# Patient Record
Sex: Female | Born: 1995 | Race: White | Hispanic: No | Marital: Married | Smoking: Never smoker
Health system: Southern US, Community
[De-identification: ages and names within clinical notes are randomized; demographics above are authoritative.]

## PROBLEM LIST (undated history)

## (undated) DIAGNOSIS — N809 Endometriosis, unspecified: Secondary | ICD-10-CM

## (undated) DIAGNOSIS — T7840XA Allergy, unspecified, initial encounter: Secondary | ICD-10-CM

## (undated) DIAGNOSIS — G709 Myoneural disorder, unspecified: Secondary | ICD-10-CM

## (undated) HISTORY — DX: Endometriosis, unspecified: N80.9

## (undated) HISTORY — DX: Myoneural disorder, unspecified: G70.9

## (undated) HISTORY — PX: OTHER SURGICAL HISTORY: SHX169

## (undated) HISTORY — PX: KNEE ARTHROSCOPY W/ ACL RECONSTRUCTION: SHX1858

## (undated) HISTORY — DX: Allergy, unspecified, initial encounter: T78.40XA

---
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
## 2015-07-01 ENCOUNTER — Ambulatory Visit (INDEPENDENT_AMBULATORY_CARE_PROVIDER_SITE_OTHER): Payer: 59 | Admitting: Family Medicine

## 2015-07-01 VITALS — BP 110/62 | HR 67 | Temp 98.6°F | Resp 18 | Ht 66.0 in | Wt 139.0 lb

## 2015-07-01 DIAGNOSIS — M25531 Pain in right wrist: Secondary | ICD-10-CM

## 2015-07-01 DIAGNOSIS — S93402A Sprain of unspecified ligament of left ankle, initial encounter: Secondary | ICD-10-CM

## 2015-07-01 NOTE — Patient Instructions (Addendum)
Avoid repetitive use of your right wrist for next few weeks, ok to use silicone or other cushioned pad under wrist with writing or computer work. Wrist brace ok if this helps. If not improved in next 2 weeks - return to xray and discuss other causes (as "TFCC injury" can present this way).   Ankle range of motion and exercises for your ankle. Return to the clinic or go to the nearest emergency room if any of your symptoms worsen or new symptoms occur.  Acute Ankle Sprain With Phase I Rehab An acute ankle sprain is a partial or complete tear in one or more of the ligaments of the ankle due to traumatic injury. The severity of the injury depends on both the number of ligaments sprained and the grade of sprain. There are 3 grades of sprains.   A grade 1 sprain is a mild sprain. There is a slight pull without obvious tearing. There is no loss of strength, and the muscle and ligament are the correct length.  A grade 2 sprain is a moderate sprain. There is tearing of fibers within the substance of the ligament where it connects two bones or two cartilages. The length of the ligament is increased, and there is usually decreased strength.  A grade 3 sprain is a complete rupture of the ligament and is uncommon. In addition to the grade of sprain, there are three types of ankle sprains.  Lateral ankle sprains: This is a sprain of one or more of the three ligaments on the outer side (lateral) of the ankle. These are the most common sprains. Medial ankle sprains: There is one large triangular ligament of the inner side (medial) of the ankle that is susceptible to injury. Medial ankle sprains are less common. Syndesmosis, "high ankle," sprains: The syndesmosis is the ligament that connects the two bones of the lower leg. Syndesmosis sprains usually only occur with very severe ankle sprains. SYMPTOMS  Pain, tenderness, and swelling in the ankle, starting at the side of injury that may progress to the whole ankle  and foot with time.  "Pop" or tearing sensation at the time of injury.  Bruising that may spread to the heel.  Impaired ability to walk soon after injury. CAUSES   Acute ankle sprains are caused by trauma placed on the ankle that temporarily forces or pries the anklebone (talus) out of its normal socket.  Stretching or tearing of the ligaments that normally hold the joint in place (usually due to a twisting injury). RISK INCREASES WITH:  Previous ankle sprain.  Sports in which the foot may land awkwardly (i.e., basketball, volleyball, or soccer) or walking or running on uneven or rough surfaces.  Shoes with inadequate support to prevent sideways motion when stress occurs.  Poor strength and flexibility.  Poor balance skills.  Contact sports. PREVENTION   Warm up and stretch properly before activity.  Maintain physical fitness:  Ankle and leg flexibility, muscle strength, and endurance.  Cardiovascular fitness.  Balance training activities.  Use proper technique and have a coach correct improper technique.  Taping, protective strapping, bracing, or high-top tennis shoes may help prevent injury. Initially, tape is best; however, it loses most of its support function within 10 to 15 minutes.  Wear proper-fitted protective shoes (High-top shoes with taping or bracing is more effective than either alone).  Provide the ankle with support during sports and practice activities for 12 months following injury. PROGNOSIS   If treated properly, ankle sprains can be expected  to recover completely; however, the length of recovery depends on the degree of injury.  A grade 1 sprain usually heals enough in 5 to 7 days to allow modified activity and requires an average of 6 weeks to heal completely.  A grade 2 sprain requires 6 to 10 weeks to heal completely.  A grade 3 sprain requires 12 to 16 weeks to heal.  A syndesmosis sprain often takes more than 3 months to heal. RELATED  COMPLICATIONS   Frequent recurrence of symptoms may result in a chronic problem. Appropriately addressing the problem the first time decreases the frequency of recurrence and optimizes healing time. Severity of the initial sprain does not predict the likelihood of later instability.  Injury to other structures (bone, cartilage, or tendon).  A chronically unstable or arthritic ankle joint is a possibility with repeated sprains. TREATMENT Treatment initially involves the use of ice, medication, and compression bandages to help reduce pain and inflammation. Ankle sprains are usually immobilized in a walking cast or boot to allow for healing. Crutches may be recommended to reduce pressure on the injury. After immobilization, strengthening and stretching exercises may be necessary to regain strength and a full range of motion. Surgery is rarely needed to treat ankle sprains. MEDICATION   Nonsteroidal anti-inflammatory medications, such as aspirin and ibuprofen (do not take for the first 3 days after injury or within 7 days before surgery), or other minor pain relievers, such as acetaminophen, are often recommended. Take these as directed by your caregiver. Contact your caregiver immediately if any bleeding, stomach upset, or signs of an allergic reaction occur from these medications.  Ointments applied to the skin may be helpful.  Pain relievers may be prescribed as necessary by your caregiver. Do not take prescription pain medication for longer than 4 to 7 days. Use only as directed and only as much as you need. HEAT AND COLD  Cold treatment (icing) is used to relieve pain and reduce inflammation for acute and chronic cases. Cold should be applied for 10 to 15 minutes every 2 to 3 hours for inflammation and pain and immediately after any activity that aggravates your symptoms. Use ice packs or an ice massage.  Heat treatment may be used before performing stretching and strengthening activities  prescribed by your caregiver. Use a heat pack or a warm soak. SEEK IMMEDIATE MEDICAL CARE IF:   Pain, swelling, or bruising worsens despite treatment.  You experience pain, numbness, discoloration, or coldness in the foot or toes.  New, unexplained symptoms develop (drugs used in treatment may produce side effects.) EXERCISES  PHASE I EXERCISES RANGE OF MOTION (ROM) AND STRETCHING EXERCISES - Ankle Sprain, Acute Phase I, Weeks 1 to 2 These exercises may help you when beginning to restore flexibility in your ankle. You will likely work on these exercises for the 1 to 2 weeks after your injury. Once your physician, physical therapist, or athletic trainer sees adequate progress, he or she will advance your exercises. While completing these exercises, remember:   Restoring tissue flexibility helps normal motion to return to the joints. This allows healthier, less painful movement and activity.  An effective stretch should be held for at least 30 seconds.  A stretch should never be painful. You should only feel a gentle lengthening or release in the stretched tissue. RANGE OF MOTION - Dorsi/Plantar Flexion  While sitting with your right / left knee straight, draw the top of your foot upwards by flexing your ankle. Then reverse the motion, pointing  your toes downward.  Hold each position for __________ seconds.  After completing your first set of exercises, repeat this exercise with your knee bent. Repeat __________ times. Complete this exercise __________ times per day.  RANGE OF MOTION - Ankle Alphabet  Imagine your right / left big toe is a pen.  Keeping your hip and knee still, write out the entire alphabet with your "pen." Make the letters as large as you can without increasing any discomfort. Repeat __________ times. Complete this exercise __________ times per day.  STRENGTHENING EXERCISES - Ankle Sprain, Acute -Phase I, Weeks 1 to 2 These exercises may help you when beginning to  restore strength in your ankle. You will likely work on these exercises for 1 to 2 weeks after your injury. Once your physician, physical therapist, or athletic trainer sees adequate progress, he or she will advance your exercises. While completing these exercises, remember:   Muscles can gain both the endurance and the strength needed for everyday activities through controlled exercises.  Complete these exercises as instructed by your physician, physical therapist, or athletic trainer. Progress the resistance and repetitions only as guided.  You may experience muscle soreness or fatigue, but the pain or discomfort you are trying to eliminate should never worsen during these exercises. If this pain does worsen, stop and make certain you are following the directions exactly. If the pain is still present after adjustments, discontinue the exercise until you can discuss the trouble with your clinician. STRENGTH - Dorsiflexors  Secure a rubber exercise band/tubing to a fixed object (i.e., table, pole) and loop the other end around your right / left foot.  Sit on the floor facing the fixed object. The band/tubing should be slightly tense when your foot is relaxed.  Slowly draw your foot back toward you using your ankle and toes.  Hold this position for __________ seconds. Slowly release the tension in the band and return your foot to the starting position. Repeat __________ times. Complete this exercise __________ times per day.  STRENGTH - Plantar-flexors   Sit with your right / left leg extended. Holding onto both ends of a rubber exercise band/tubing, loop it around the ball of your foot. Keep a slight tension in the band.  Slowly push your toes away from you, pointing them downward.  Hold this position for __________ seconds. Return slowly, controlling the tension in the band/tubing. Repeat __________ times. Complete this exercise __________ times per day.  STRENGTH - Ankle Eversion  Secure  one end of a rubber exercise band/tubing to a fixed object (table, pole). Loop the other end around your foot just before your toes.  Place your fists between your knees. This will focus your strengthening at your ankle.  Drawing the band/tubing across your opposite foot, slowly, pull your little toe out and up. Make sure the band/tubing is positioned to resist the entire motion.  Hold this position for __________ seconds. Have your muscles resist the band/tubing as it slowly pulls your foot back to the starting position.  Repeat __________ times. Complete this exercise __________ times per day.  STRENGTH - Ankle Inversion  Secure one end of a rubber exercise band/tubing to a fixed object (table, pole). Loop the other end around your foot just before your toes.  Place your fists between your knees. This will focus your strengthening at your ankle.  Slowly, pull your big toe up and in, making sure the band/tubing is positioned to resist the entire motion.  Hold this position for  __________ seconds.  Have your muscles resist the band/tubing as it slowly pulls your foot back to the starting position. Repeat __________ times. Complete this exercises __________ times per day.  STRENGTH - Towel Curls  Sit in a chair positioned on a non-carpeted surface.  Place your right / left foot on a towel, keeping your heel on the floor.  Pull the towel toward your heel by only curling your toes. Keep your heel on the floor.  If instructed by your physician, physical therapist, or athletic trainer, add weight to the end of the towel. Repeat __________ times. Complete this exercise __________ times per day.   This information is not intended to replace advice given to you by your health care provider. Make sure you discuss any questions you have with your health care provider.   Document Released: 02/17/2005 Document Revised: 08/09/2014 Document Reviewed: 10/31/2008 [employer] Interactive Patient  Education 2016 [employer]   Wrist Pain There are many things that can cause wrist pain. Some common causes include:  An injury to the wrist area, such as a sprain, strain, or fracture.  Overuse of the joint.  A condition that causes increased pressure on a nerve in the wrist (carpal tunnel syndrome).  Wear and tear of the joints that occurs with aging (osteoarthritis).  A variety of other types of arthritis. Sometimes, the cause of wrist pain is not known. The pain often goes away when you follow your health care provider's instructions for relieving pain at home. If your wrist pain continues, tests may need to be done to diagnose your condition. HOME CARE INSTRUCTIONS Pay attention to any changes in your symptoms. Take these actions to help with your pain:  Rest the wrist area for at least 48 hours or as told by your health care provider.  If directed, apply ice to the injured area:  Put ice in a plastic bag.  Place a towel between your skin and the bag.  Leave the ice on for 20 minutes, 2-3 times per day.  Keep your arm raised (elevated) above the level of your heart while you are sitting or lying down.  If a splint or elastic bandage has been applied, use it as told by your health care provider.  Remove the splint or bandage only as told by your health care provider.  Loosen the splint or bandage if your fingers become numb or have a tingling feeling, or if they turn cold or blue.  Take over-the-counter and prescription medicines only as told by your health care provider.  Keep all follow-up visits as told by your health care provider. This is important. SEEK MEDICAL CARE IF:  Your pain is not helped by treatment.  Your pain gets worse. SEEK IMMEDIATE MEDICAL CARE IF:  Your fingers become swollen.  Your fingers turn white, very red, or cold and blue.  Your fingers are numb or have a tingling feeling.  You have difficulty moving your fingers.   This  information is not intended to replace advice given to you by your health care provider. Make sure you discuss any questions you have with your health care provider.   Document Released: 04/28/2005 Document Revised: 04/09/2015 Document Reviewed: 12/04/2014 [employer] Interactive Patient Education [employer]

## 2015-07-01 NOTE — Progress Notes (Addendum)
Subjective:  This chart was scribed for Mary Staggers, MD by Broadus John, Medical Scribe. This patient was seen in Room 1 and the patient's care was started at 1:38 PM.   Patient ID: Mary Reilly, female    DOB: [birth year], 19 y.o.   MRN: [id number]  Chief Complaint  Patient presents with  . Ankle Injury    left rolled over the weekend needs note for school  . Wrist Pain    rt pain x2 mths now     HPI HPI Comments: Mary Reilly is a 19 y.o. female who presents to [facility] complaining of a left ankle injury, onset 4 days ago.  Pt notes that she rolled and  inverted her ankle while playing Frisbee, however she was still able to weight bear since onset. She indicates the area was sore after onset of injury, however she reports that the pain has improved. Pt denies numbness or tingling of the area. Pt is requesting a note for school due to missing an assignment.    Pt also reports a right wrist pain that first started about 2 months ago. She notes that the pain could be attributed to her sleeping position where she sleeps on her right arm. 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 Pt also notes that she lifts weight. Pt's pain has been worsening during the past 2 weeks, and she believes that could be due to playing Frisbee more often. She states that the pain is more severe with supination of the hand, however not as much with rotation. She indicates that she wrapped with an ACE bandage, however she has not been compliant with that very much. Pt denies numbness or tingling of the area, unexpected weight loss, or fever. Pt is right-handed.  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   There are no active problems to display for this patient.  Past Medical History  Diagnosis Date  . Allergy   .  Neuromuscular disorder [facility])    Past Surgical History  Procedure Laterality Date  . Knee arthroscopy w/ acl reconstruction     Allergies  Allergen Reactions  . Cephalosporins   . Sulfa Antibiotics    Prior to Admission medications   Medication Sig Start Date End Date Taking? Authorizing Provider  etonogestrel-ethinyl estradiol (NUVARING) 0.12-0.015 MG/24HR vaginal ring Place 1 each vaginally every 28 (twenty-eight) days. Insert vaginally and leave in place for 3 consecutive weeks, then remove for 1 week.   Yes Historical Provider, MD  fluocinonide cream (LIDEX) 0.05 % Apply 1 application topically 2 (two) times daily.   Yes Historical Provider, MD   Social History   Social History  . Marital Status: Single    Spouse Name: N/A  . Number of Children: N/A  . Years of Education: N/A   Occupational History  . Not on file.   Social History Main Topics  . Smoking status: Never Smoker   . Smokeless tobacco: Not on file  . Alcohol Use: No  . Drug Use: No  . Sexual Activity: Not on file   Other Topics Concern  . Not on file   Social History Narrative  . No narrative on file    Review of Systems  Constitutional: Negative for fever and unexpected weight change.  Musculoskeletal: Positive  for arthralgias.  Neurological: Negative for weakness and numbness.      Objective:   Physical Exam  Constitutional: She is oriented to person, place, and time. She appears well-developed and well-nourished. No distress.  HENT:  Head: Normocephalic and atraumatic.  Eyes: EOM are normal. Pupils are equal, round, and reactive to light.  Neck: Neck supple.  Cardiovascular: Normal rate.   Pulmonary/Chest: Effort normal.  Musculoskeletal:  Right knee- Negative tinel's over the fibular head. Tip fib- lower tip-fib negative squeeze. Negative kleiger/external rotation stress testing.  Right ankle- Achilles is non tender. Malleoli non tender. Slight laxity with talar tilt. Negative drawer.    Right foot- no navicular or 5th metatarsal tenderness. ROM is intact. Pain with resisted eversion but strength is intact. nvi distally.   Right wrist- skin intact, no erythema. Equal ROM to the left, but pain with supination and radial deviation. Radius-ulnar non tender. Forearm non tender. DRUJ non tender. Radial and ulnar styloid non tender. Scaphoid non tender. Hook of the hamate non tender. Negative finkelstein. Negative tinel's at the wrist and ulnar wrist.  Negative phalen's.  Neurological: She is alert and oriented to person, place, and time. No cranial nerve deficit.  Skin: Skin is warm and dry.  Psychiatric: She has a normal mood and affect. Her behavior is normal.  Nursing note and vitals reviewed.   Filed Vitals:   07/01/15 1256  BP: 110/62  Pulse: 67  Temp: 98.6 F (37 C)  TempSrc: Oral  Resp: 18  Height:  (1.676 m)  Weight: 139 lb (63.05 kg)  SpO2: 99%      Assessment & Plan:   Mary Reilly is a 19 y.o. female Right wrist pain  - Possible overuse injury versus compressive neuropathy of radial nerve at Guyon's, but would suspect more dysesthesias into fourth and fifth fingers.  Also based on location, TFCC injury in differential diagnosis, but no specific injury known.  -Relative rest, avoid maneuvers that trigger pain in that area, okay to use silicone or other padding when writing, and recheck in the next 2 weeks if symptoms persist. At that time would consider x-rays or possible hand surgery eval for possible TFCC issue.  Left ankle sprain, initial encounter  -Mild, suspect grade 1 without significant swelling.  -Continue range of motion, home exercise program. RTC precautions.  -Note given for school as with her major she has a class requiring jogging. Return to activity next week as long as her symptoms have improved.   Patient Instructions  Avoid repetitive use of your right wrist for next few weeks, ok to use silicone or other cushioned pad under wrist  with writing or computer work. Wrist brace ok if this helps. If not improved in next 2 weeks - return to xray and discuss other causes (as "TFCC injury" can present this way).   Ankle range of motion and exercises for your ankle. Return to the clinic or go to the nearest emergency room if any of your symptoms worsen or new symptoms occur.  Acute Ankle Sprain With Phase I Rehab An acute ankle sprain is a partial or complete tear in one or more of the ligaments of the ankle due to traumatic injury. The severity of the injury depends on both the number of ligaments sprained and the grade of sprain. There are 3 grades of sprains.   A grade 1 sprain is a mild sprain. There is a slight pull without obvious tearing. There is no loss of strength, and the  muscle and ligament are the correct length.  A grade 2 sprain is a moderate sprain. There is tearing of fibers within the substance of the ligament where it connects two bones or two cartilages. The length of the ligament is increased, and there is usually decreased strength.  A grade 3 sprain is a complete rupture of the ligament and is uncommon. In addition to the grade of sprain, there are three types of ankle sprains.  Lateral ankle sprains: This is a sprain of one or more of the three ligaments on the outer side (lateral) of the ankle. These are the most common sprains. Medial ankle sprains: There is one large triangular ligament of the inner side (medial) of the ankle that is susceptible to injury. Medial ankle sprains are less common. Syndesmosis, "high ankle," sprains: The syndesmosis is the ligament that connects the two bones of the lower leg. Syndesmosis sprains usually only occur with very severe ankle sprains. SYMPTOMS  Pain, tenderness, and swelling in the ankle, starting at the side of injury that may progress to the whole ankle and foot with time.  "Pop" or tearing sensation at the time of injury.  Bruising that may spread to the  heel.  Impaired ability to walk soon after injury. CAUSES   Acute ankle sprains are caused by trauma placed on the ankle that temporarily forces or pries the anklebone (talus) out of its normal socket.  Stretching or tearing of the ligaments that normally hold the joint in place (usually due to a twisting injury). RISK INCREASES WITH:  Previous ankle sprain.  Sports in which the foot may land awkwardly (i.e., basketball, volleyball, or soccer) or walking or running on uneven or rough surfaces.  Shoes with inadequate support to prevent sideways motion when stress occurs.  Poor strength and flexibility.  Poor balance skills.  Contact sports. PREVENTION   Warm up and stretch properly before activity.  Maintain physical fitness:  Ankle and leg flexibility, muscle strength, and endurance.  Cardiovascular fitness.  Balance training activities.  Use proper technique and have a coach correct improper technique.  Taping, protective strapping, bracing, or high-top tennis shoes may help prevent injury. Initially, tape is best; however, it loses most of its support function within 10 to 15 minutes.  Wear proper-fitted protective shoes (High-top shoes with taping or bracing is more effective than either alone).  Provide the ankle with support during sports and practice activities for 12 months following injury. PROGNOSIS   If treated properly, ankle sprains can be expected to recover completely; however, the length of recovery depends on the degree of injury.  A grade 1 sprain usually heals enough in 5 to 7 days to allow modified activity and requires an average of 6 weeks to heal completely.  A grade 2 sprain requires 6 to 10 weeks to heal completely.  A grade 3 sprain requires 12 to 16 weeks to heal.  A syndesmosis sprain often takes more than 3 months to heal. RELATED COMPLICATIONS   Frequent recurrence of symptoms may result in a chronic problem. Appropriately addressing  the problem the first time decreases the frequency of recurrence and optimizes healing time. Severity of the initial sprain does not predict the likelihood of later instability.  Injury to other structures (bone, cartilage, or tendon).  A chronically unstable or arthritic ankle joint is a possibility with repeated sprains. TREATMENT Treatment initially involves the use of ice, medication, and compression bandages to help reduce pain and inflammation. Ankle sprains are usually immobilized  in a walking cast or boot to allow for healing. Crutches may be recommended to reduce pressure on the injury. After immobilization, strengthening and stretching exercises may be necessary to regain strength and a full range of motion. Surgery is rarely needed to treat ankle sprains. MEDICATION   Nonsteroidal anti-inflammatory medications, such as aspirin and ibuprofen (do not take for the first 3 days after injury or within 7 days before surgery), or other minor pain relievers, such as acetaminophen, are often recommended. Take these as directed by your caregiver. Contact your caregiver immediately if any bleeding, stomach upset, or signs of an allergic reaction occur from these medications.  Ointments applied to the skin may be helpful.  Pain relievers may be prescribed as necessary by your caregiver. Do not take prescription pain medication for longer than 4 to 7 days. Use only as directed and only as much as you need. HEAT AND COLD  Cold treatment (icing) is used to relieve pain and reduce inflammation for acute and chronic cases. Cold should be applied for 10 to 15 minutes every 2 to 3 hours for inflammation and pain and immediately after any activity that aggravates your symptoms. Use ice packs or an ice massage.  Heat treatment may be used before performing stretching and strengthening activities prescribed by your caregiver. Use a heat pack or a warm soak. SEEK IMMEDIATE MEDICAL CARE IF:   Pain, swelling,  or bruising worsens despite treatment.  You experience pain, numbness, discoloration, or coldness in the foot or toes.  New, unexplained symptoms develop (drugs used in treatment may produce side effects.) EXERCISES  PHASE I EXERCISES RANGE OF MOTION (ROM) AND STRETCHING EXERCISES - Ankle Sprain, Acute Phase I, Weeks 1 to 2 These exercises may help you when beginning to restore flexibility in your ankle. You will likely work on these exercises for the 1 to 2 weeks after your injury. Once your physician, physical therapist, or athletic trainer sees adequate progress, he or she will advance your exercises. While completing these exercises, remember:   Restoring tissue flexibility helps normal motion to return to the joints. This allows healthier, less painful movement and activity.  An effective stretch should be held for at least 30 seconds.  A stretch should never be painful. You should only feel a gentle lengthening or release in the stretched tissue. RANGE OF MOTION - Dorsi/Plantar Flexion  While sitting with your right / left knee straight, draw the top of your foot upwards by flexing your ankle. Then reverse the motion, pointing your toes downward.  Hold each position for __________ seconds.  After completing your first set of exercises, repeat this exercise with your knee bent. Repeat __________ times. Complete this exercise __________ times per day.  RANGE OF MOTION - Ankle Alphabet  Imagine your right / left big toe is a pen.  Keeping your hip and knee still, write out the entire alphabet with your "pen." Make the letters as large as you can without increasing any discomfort. Repeat __________ times. Complete this exercise __________ times per day.  STRENGTHENING EXERCISES - Ankle Sprain, Acute -Phase I, Weeks 1 to 2 These exercises may help you when beginning to restore strength in your ankle. You will likely work on these exercises for 1 to 2 weeks after your injury. Once your  physician, physical therapist, or athletic trainer sees adequate progress, he or she will advance your exercises. While completing these exercises, remember:   Muscles can gain both the endurance and the strength needed for  everyday activities through controlled exercises.  Complete these exercises as instructed by your physician, physical therapist, or athletic trainer. Progress the resistance and repetitions only as guided.  You may experience muscle soreness or fatigue, but the pain or discomfort you are trying to eliminate should never worsen during these exercises. If this pain does worsen, stop and make certain you are following the directions exactly. If the pain is still present after adjustments, discontinue the exercise until you can discuss the trouble with your clinician. STRENGTH - Dorsiflexors  Secure a rubber exercise band/tubing to a fixed object (i.e., table, pole) and loop the other end around your right / left foot.  Sit on the floor facing the fixed object. The band/tubing should be slightly tense when your foot is relaxed.  Slowly draw your foot back toward you using your ankle and toes.  Hold this position for __________ seconds. Slowly release the tension in the band and return your foot to the starting position. Repeat __________ times. Complete this exercise __________ times per day.  STRENGTH - Plantar-flexors   Sit with your right / left leg extended. Holding onto both ends of a rubber exercise band/tubing, loop it around the ball of your foot. Keep a slight tension in the band.  Slowly push your toes away from you, pointing them downward.  Hold this position for __________ seconds. Return slowly, controlling the tension in the band/tubing. Repeat __________ times. Complete this exercise __________ times per day.  STRENGTH - Ankle Eversion  Secure one end of a rubber exercise band/tubing to a fixed object (table, pole). Loop the other end around your foot just  before your toes.  Place your fists between your knees. This will focus your strengthening at your ankle.  Drawing the band/tubing across your opposite foot, slowly, pull your little toe out and up. Make sure the band/tubing is positioned to resist the entire motion.  Hold this position for __________ seconds. Have your muscles resist the band/tubing as it slowly pulls your foot back to the starting position.  Repeat __________ times. Complete this exercise __________ times per day.  STRENGTH - Ankle Inversion  Secure one end of a rubber exercise band/tubing to a fixed object (table, pole). Loop the other end around your foot just before your toes.  Place your fists between your knees. This will focus your strengthening at your ankle.  Slowly, pull your big toe up and in, making sure the band/tubing is positioned to resist the entire motion.  Hold this position for __________ seconds.  Have your muscles resist the band/tubing as it slowly pulls your foot back to the starting position. Repeat __________ times. Complete this exercises __________ times per day.  STRENGTH - Towel Curls  Sit in a chair positioned on a non-carpeted surface.  Place your right / left foot on a towel, keeping your heel on the floor.  Pull the towel toward your heel by only curling your toes. Keep your heel on the floor.  If instructed by your physician, physical therapist, or athletic trainer, add weight to the end of the towel. Repeat __________ times. Complete this exercise __________ times per day.   This information is not intended to replace advice given to you by your health care provider. Make sure you discuss any questions you have with your health care provider.   Document Released: 02/17/2005 Document Revised: 08/09/2014 Document Reviewed: 10/31/2008 Elsevier Interactive Patient Education 2016 Else[employer] Inc[employer]   Wrist Pain There are many things that can cause  wrist pain. Some common causes  include:  An injury to the wrist area, such as a sprain, strain, or fracture.  Overuse of the joint.  A condition that causes increased pressure on a nerve in the wrist (carpal tunnel syndrome).  Wear and tear of the joints that occurs with aging (osteoarthritis).  A variety of other types of arthritis. Sometimes, the cause of wrist pain is not known. The pain often goes away when you follow your health care provider's instructions for relieving pain at home. If your wrist pain continues, tests may need to be done to diagnose your condition. HOME CARE INSTRUCTIONS Pay attention to any changes in your symptoms. Take these actions to help with your pain:  Rest the wrist area for at least 48 hours or as told by your health care provider.  If directed, apply ice to the injured area:  Put ice in a plastic bag.  Place a towel between your skin and the bag.  Leave the ice on for 20 minutes, 2-3 times per day.  Keep your arm raised (elevated) above the level of your heart while you are sitting or lying down.  If a splint or elastic bandage has been applied, use it as told by your health care provider.  Remove the splint or bandage only as told by your health care provider.  Loosen the splint or bandage if your fingers become numb or have a tingling feeling, or if they turn cold or blue.  Take over-the-counter and prescription medicines only as told by your health care provider.  Keep all follow-up visits as told by your health care provider. This is important. SEEK MEDICAL CARE IF:  Your pain is not helped by treatment.  Your pain gets worse. SEEK IMMEDIATE MEDICAL CARE IF:  Your fingers become swollen.  Your fingers turn white, very red, or cold and blue.  Your fingers are numb or have a tingling feeling.  You have difficulty moving your fingers.   This information is not intended to replace advice given to you by your health care provider. Make sure you discuss any  questions you have with your health care provider.   Document Released: 04/28/2005 Document Revised: 04/09/2015 Document Reviewed: 12/04/2014 Elsevier Interactive Patient Education [employer]      By signing my name below, I, Rawaa Al Rifaie, attest that this documentation has been prepared under the direction and in the presence of Mary StaggersJeffrey Jayel Scaduto, MD.  Watt Climesawaa Al Rifaie, Medical Scribe. 07/01/2015.  2:04 PM. I personally performed the services described in this documentation, which was scribed in my presence. The recorded information has been reviewed and considered, and addended by me as needed.

## 2016-08-05 ENCOUNTER — Emergency Department (HOSPITAL_BASED_OUTPATIENT_CLINIC_OR_DEPARTMENT_OTHER)
Admission: EM | Admit: 2016-08-05 | Discharge: 2016-08-05 | Disposition: A | Discharge status: Home / Self Care | Payer: 59 | Attending: Emergency Medicine | Admitting: Emergency Medicine

## 2016-08-05 ENCOUNTER — Encounter (HOSPITAL_BASED_OUTPATIENT_CLINIC_OR_DEPARTMENT_OTHER): Payer: Self-pay

## 2016-08-05 DIAGNOSIS — H66001 Acute suppurative otitis media without spontaneous rupture of ear drum, right ear: Secondary | ICD-10-CM | POA: Diagnosis not present

## 2016-08-05 DIAGNOSIS — H9201 Otalgia, right ear: Secondary | ICD-10-CM | POA: Diagnosis present

## 2016-08-05 MED ORDER — IBUPROFEN 400 MG PO TABS: 600.0000 mg | ORAL_TABLET | Freq: Once | ORAL | Status: DC

## 2016-08-05 MED ORDER — AMOXICILLIN-POT CLAVULANATE 875-125 MG PO TABS
1.0000 | ORAL_TABLET | Freq: Once | ORAL | Status: AC
  Administered 2016-08-05: 1 via ORAL
  Filled 2016-08-05: qty 1

## 2016-08-05 MED ORDER — AMOXICILLIN-POT CLAVULANATE 875-125 MG PO TABS: 1.0000 | ORAL_TABLET | Freq: Two times a day (BID) | ORAL | 0 refills | Status: DC

## 2016-08-05 MED ORDER — KETOROLAC TROMETHAMINE 60 MG/2ML IM SOLN
60.0000 mg | Freq: Once | INTRAMUSCULAR | Status: AC
  Administered 2016-08-05: 60 mg via INTRAMUSCULAR
  Filled 2016-08-05: qty 2

## 2016-08-05 NOTE — ED Triage Notes (Addendum)
Pt c/o right ear pain for the last two hours, just flew in an airplane tonight and has had recent cough and congestion, no meds taken prior to arrival

## 2016-08-05 NOTE — ED Notes (Signed)
Pt verbalizes understanding of d/c instructions and denies any further needs at this time. 

## 2016-08-05 NOTE — ED Provider Notes (Signed)
[facility] Provider Note   CSN: [id number] Arrival date & time: 08/05/16  0431     History   Chief Complaint Chief Complaint  Patient presents with  . Otalgia    HPI Mary Reilly is a 21 y.o. female.  HPI  21 year old female presents with severe right ear pain x 2 hours. Flew back from [place] yesterday. Has had cough/congestion for 2 weeks. No fevers. No current sore throat (although originally had 2 weeks ago). No ear drainage. No headache, dyspnea. Felt typical popping during flight but never felt like right ear popped all the way. No treatments prior to arrival. History of frequent ear infections, especially after airplane flights.  Past Medical History:  Diagnosis Date  . Allergy   . Neuromuscular disorder (HCC)     There are no active problems to display for this patient.   Past Surgical History:  Procedure Laterality Date  . KNEE ARTHROSCOPY W/ ACL RECONSTRUCTION      OB History    No data available       Home Medications    Prior to Admission medications   Medication Sig Start Date End Date Taking? Authorizing Provider  amoxicillin-clavulanate (AUGMENTIN) 875-125 MG tablet Take 1 tablet by mouth 2 (two) times daily. One po bid x 7 days 08/05/16   Pricilla Loveless, MD  etonogestrel-ethinyl estradiol (NUVARING) 0.12-0.015 MG/24HR vaginal ring Place 1 each vaginally every 28 (twenty-eight) days. Insert vaginally and leave in place for 3 consecutive weeks, then remove for 1 week.    Historical Provider, MD  fluocinonide cream (LIDEX) 0.05 % Apply 1 application topically 2 (two) times daily.    Historical Provider, MD    Family History Family History  Problem Relation Age of Onset  . Hyperlipidemia Father   . Cancer Maternal Grandmother   . Parkinson's disease Maternal Grandfather   . Cancer Paternal Grandmother   . Cancer Paternal Grandfather     Social History Social History  Substance Use Topics  . Smoking status: Never Smoker  .  Smokeless tobacco: Not on file  . Alcohol use No     Allergies   Cephalosporins and Sulfa antibiotics   Review of Systems Review of Systems  Constitutional: Negative for fever.  HENT: Positive for congestion and ear pain. Negative for ear discharge and sore throat.   Respiratory: Positive for cough. Negative for shortness of breath.   Neurological: Negative for headaches.  All other systems reviewed and are negative.    Physical Exam Updated Vital Signs BP 122/75 (BP Location: Left Arm)   Pulse 70   Temp 97.8 F (36.6 C) (Oral)   Resp 18   Ht 5\' 6"  (1.676 m)   Wt 135 lb (61.2 kg)   LMP 07/21/2016   SpO2 100%   BMI 21.79 kg/m   Physical Exam  Constitutional: She is oriented to person, place, and time. She appears well-developed and well-nourished.  Pacing  HENT:  Head: Normocephalic and atraumatic.  Right Ear: External ear normal. No drainage. Tympanic membrane is erythematous. Tympanic membrane is not perforated.  Left Ear: External ear normal.  Nose: Nose normal.  Mouth/Throat: Oropharynx is clear and moist. No oropharyngeal exudate.  Mild redness and fluid at right TM. No perforation  Eyes: Right eye exhibits no discharge. Left eye exhibits no discharge.  Neck: Neck supple.  Cardiovascular: Normal rate, regular rhythm and normal heart sounds.   Pulmonary/Chest: Effort normal and breath sounds normal. She has no wheezes. She has no rales.  Abdominal: She exhibits no distension.  Neurological: She is alert and oriented to person, place, and time.  Skin: Skin is warm and dry. She is not diaphoretic.  Nursing note and vitals reviewed.    ED Treatments / Results  Labs (all labs ordered are listed, but only abnormal results are displayed) Labs Reviewed - No data to display  EKG  EKG Interpretation None       Radiology No results found.  Procedures Procedures (including critical care time)  Medications Ordered in ED Medications    amoxicillin-clavulanate (AUGMENTIN) 875-125 MG per tablet 1 tablet (not administered)  ketorolac (TORADOL) injection 60 mg (not administered)     Initial Impression / Assessment and Plan / ED Course  I have reviewed the triage vital signs and the nursing notes.  Pertinent labs & imaging results that were available during my care of the patient were reviewed by me and considered in my medical decision making (see chart for details).  Clinical Course     Patient appears to have a developing acute otitis media. No fevers or systemic symptoms. Likely related to congestion from URI and plane flight. No obvious perforation. IM toradol given degree of pain. Has cephalosporin allergy but has used PCNs without issue. Family requesting augmentin as amoxicillin never works for her in past. Will give this now and Rx, and refer to PCP. They decline ENT referral at this time, saw about 8 years ago for frequent otitis media. Return precautions.  Final Clinical Impressions(s) / ED Diagnoses   Final diagnoses:  Acute suppurative otitis media of right ear without spontaneous rupture of tympanic membrane, recurrence not specified    New Prescriptions New Prescriptions   AMOXICILLIN-CLAVULANATE (AUGMENTIN) 875-125 MG TABLET    Take 1 tablet by mouth 2 (two) times daily. One po bid x 7 days     Pricilla LovelessScott Porschia Willbanks, MD 08/05/16 [phone]

## 2020-04-30 ENCOUNTER — Ambulatory Visit: Payer: Self-pay | Attending: Internal Medicine

## 2020-04-30 DIAGNOSIS — Z23 Encounter for immunization: Secondary | ICD-10-CM

## 2020-04-30 NOTE — Progress Notes (Signed)
   Covid-19 Vaccination Clinic  Name:  Mary Reilly    MRN: [id number] DOB: 07-Jan-1996  04/30/2020  Mary Reilly was observed post Covid-19 immunization for 15 minutes without incident. She was provided with Vaccine Information Sheet and instruction to access the V-Safe system.   Mary Reilly was instructed to call 911 with any severe reactions post vaccine: Marland Kitchen Difficulty breathing  . Swelling of face and throat  . A fast heartbeat  . A bad rash all over body  . Dizziness and weakness

## 2020-05-02 ENCOUNTER — Other Ambulatory Visit (HOSPITAL_COMMUNITY): Payer: Self-pay | Admitting: Internal Medicine

## 2020-12-31 ENCOUNTER — Other Ambulatory Visit: Payer: Self-pay | Admitting: Nurse Practitioner

## 2020-12-31 DIAGNOSIS — N979 Female infertility, unspecified: Secondary | ICD-10-CM

## 2021-01-08 ENCOUNTER — Ambulatory Visit
Admission: RE | Admit: 2021-01-08 | Discharge: 2021-01-08 | Disposition: A | Discharge status: Home / Self Care | Payer: Managed Care, Other (non HMO) | Source: Ambulatory Visit | Attending: Nurse Practitioner | Admitting: Nurse Practitioner

## 2021-01-08 DIAGNOSIS — N979 Female infertility, unspecified: Secondary | ICD-10-CM

## 2021-04-30 ENCOUNTER — Ambulatory Visit (HOSPITAL_BASED_OUTPATIENT_CLINIC_OR_DEPARTMENT_OTHER): Admit: 2021-04-30 | Payer: Managed Care, Other (non HMO) | Admitting: Obstetrics and Gynecology

## 2021-04-30 ENCOUNTER — Encounter (HOSPITAL_BASED_OUTPATIENT_CLINIC_OR_DEPARTMENT_OTHER): Payer: Self-pay

## 2021-04-30 SURGERY — LYSIS, ADHESIONS, LAPAROSCOPIC
Anesthesia: General

## 2021-11-27 IMAGING — RF DG HYSTEROGRAM
1 series · 7 of 7 positions shown · IV contrast (omnipaque)
Comparison: None.

CLINICAL DATA: Desires fertility.

EXAM:
HYSTEROSALPINGOGRAM
TECHNIQUE: Following cleansing of the cervix and vagina with Betadine solution,
a hysterosalpingogram was performed using a 5-French
hysterosalpingogram catheter and Omnipaque 300 contrast. The patient
tolerated the examination without difficulty.

[Series 1: one shot · 7 of 7 slices shown]
[im 1/7]
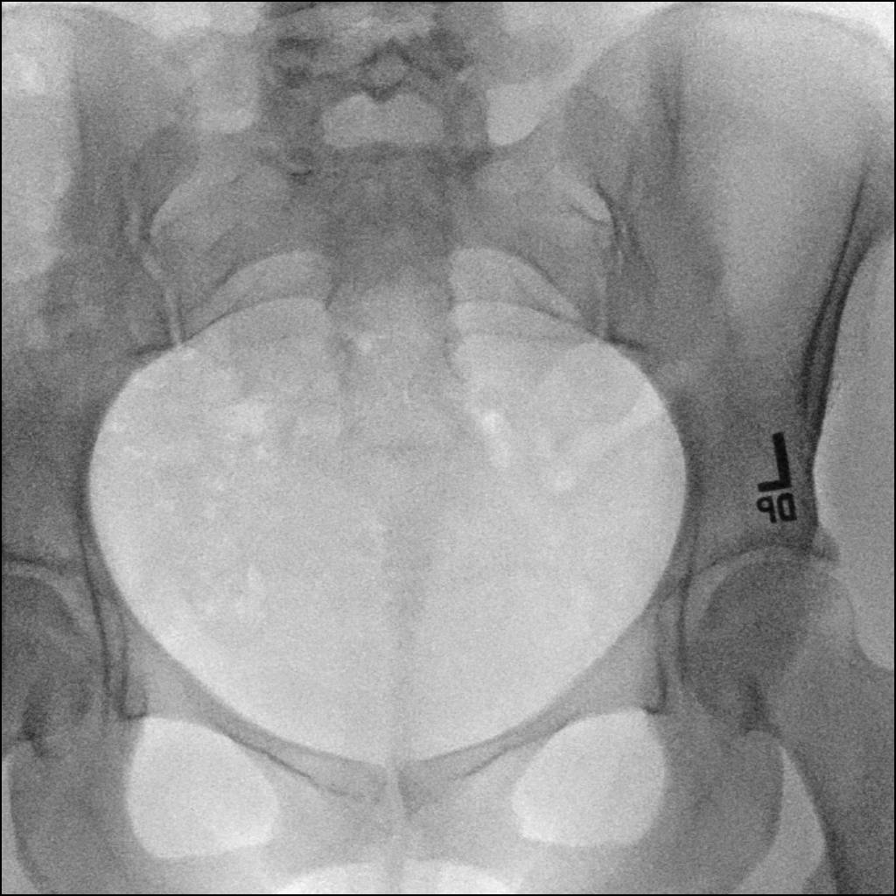
[im 2/7]
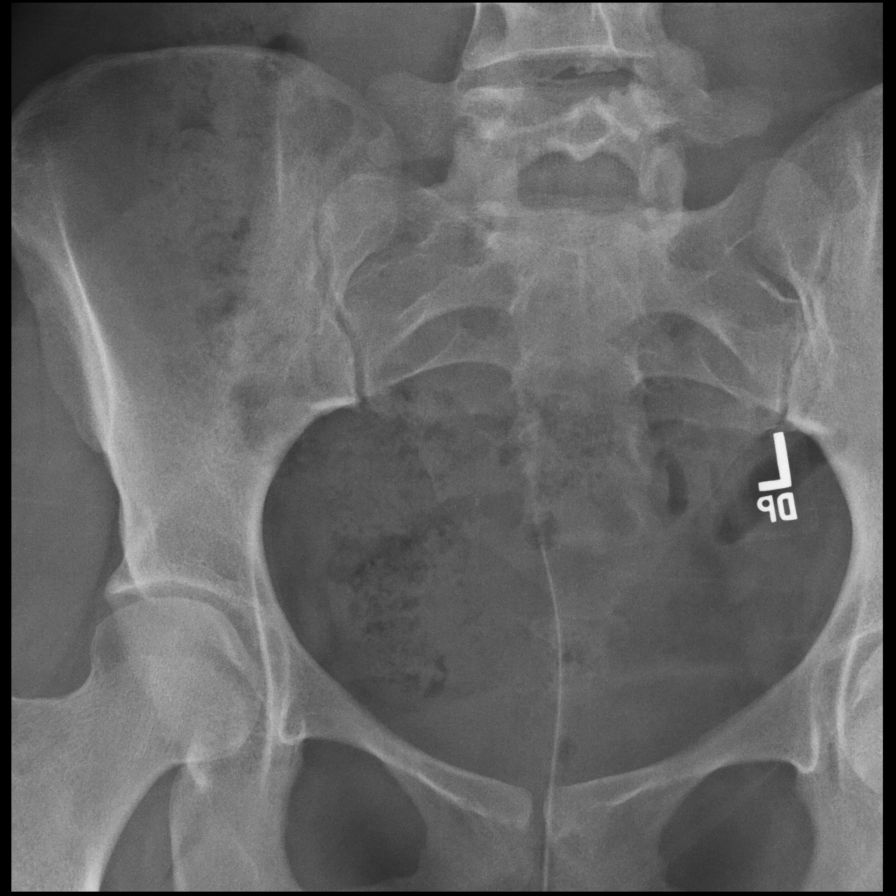
[im 3/7]
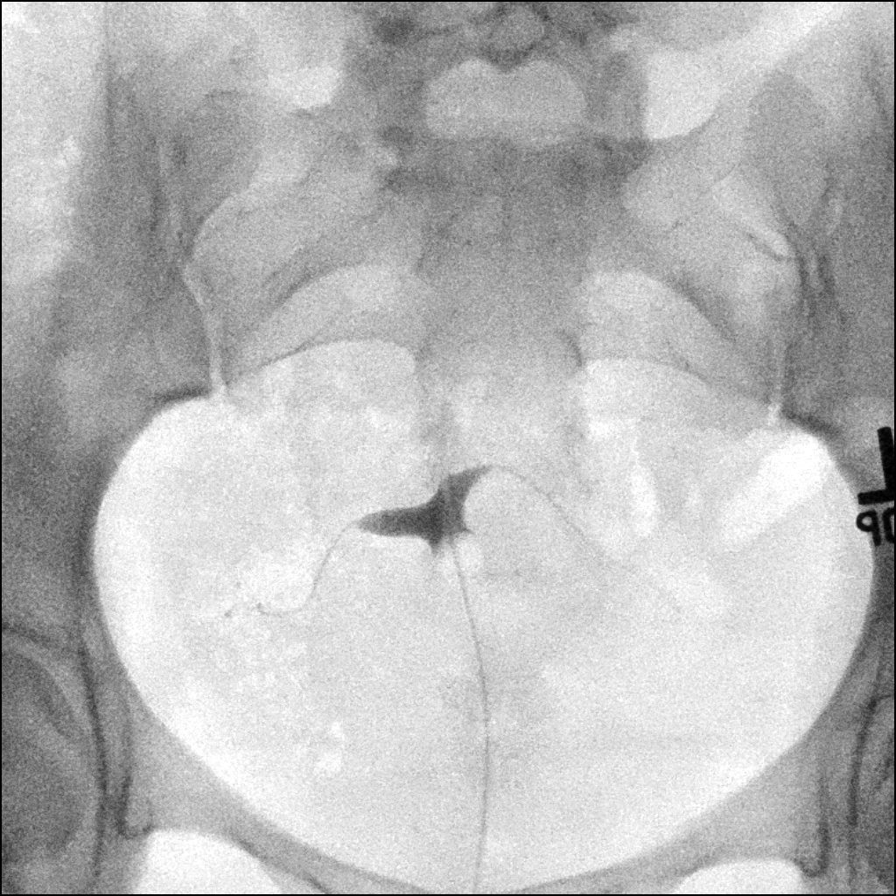
[im 4/7]
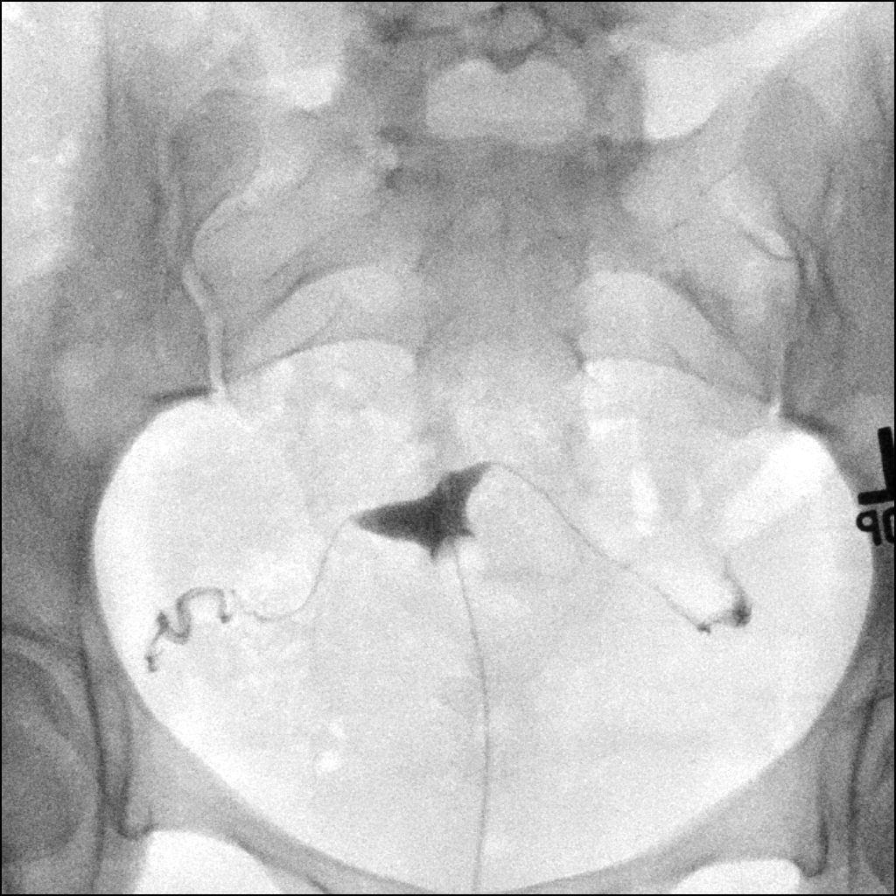
[im 5/7]
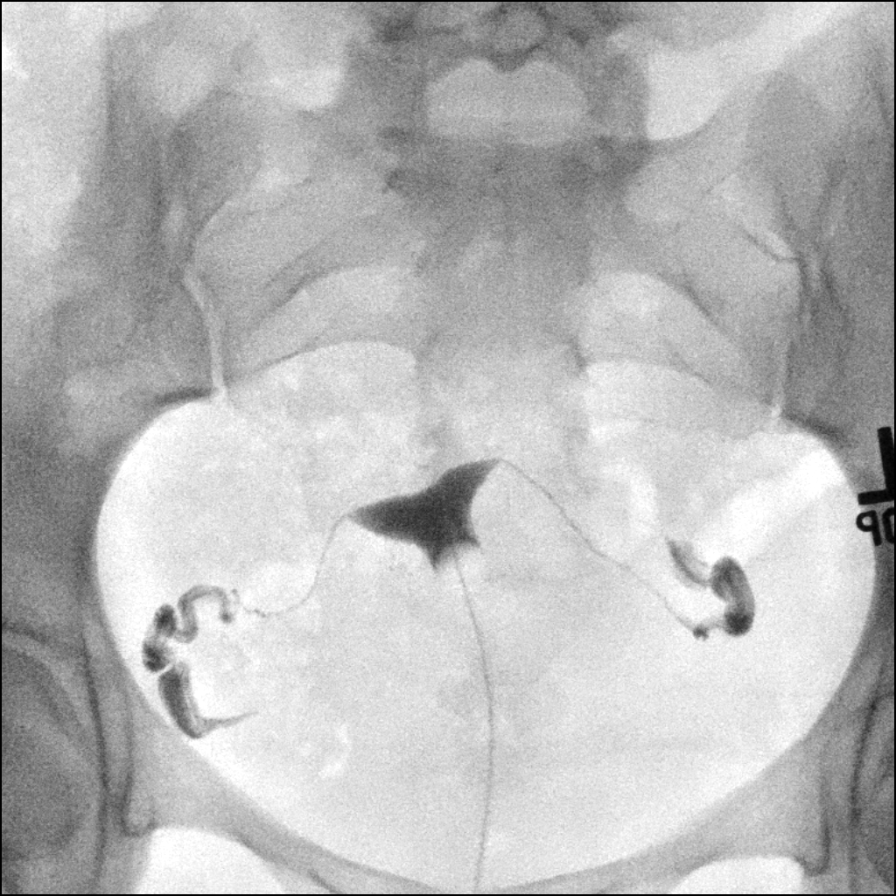
[im 6/7]
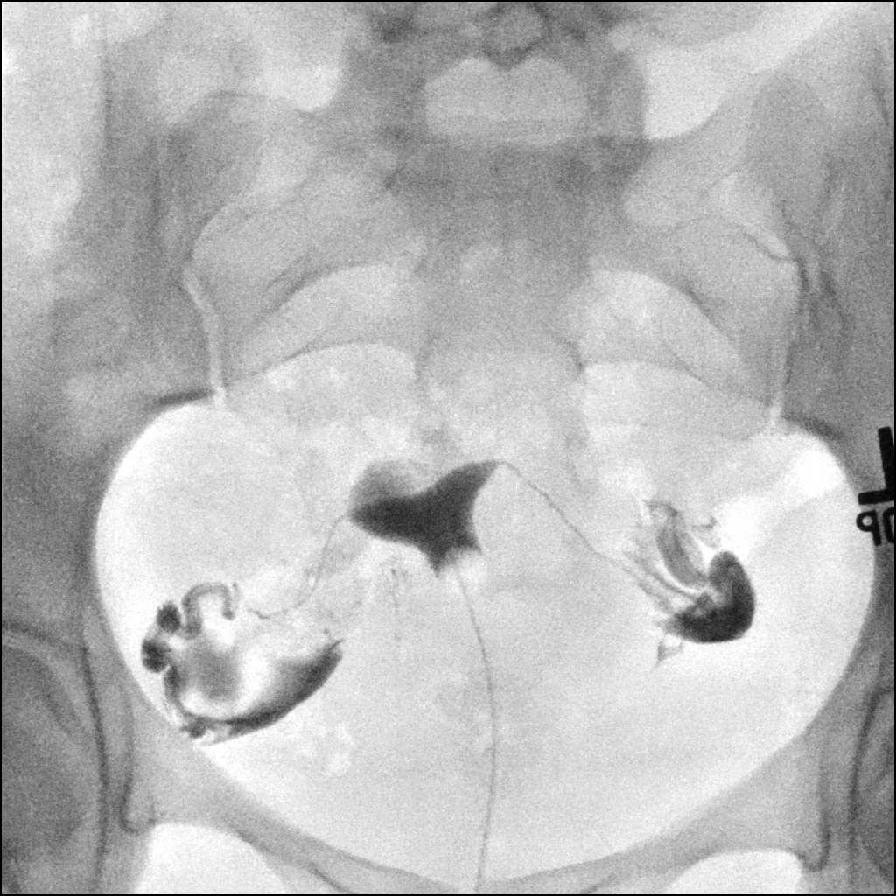
[im 7/7]
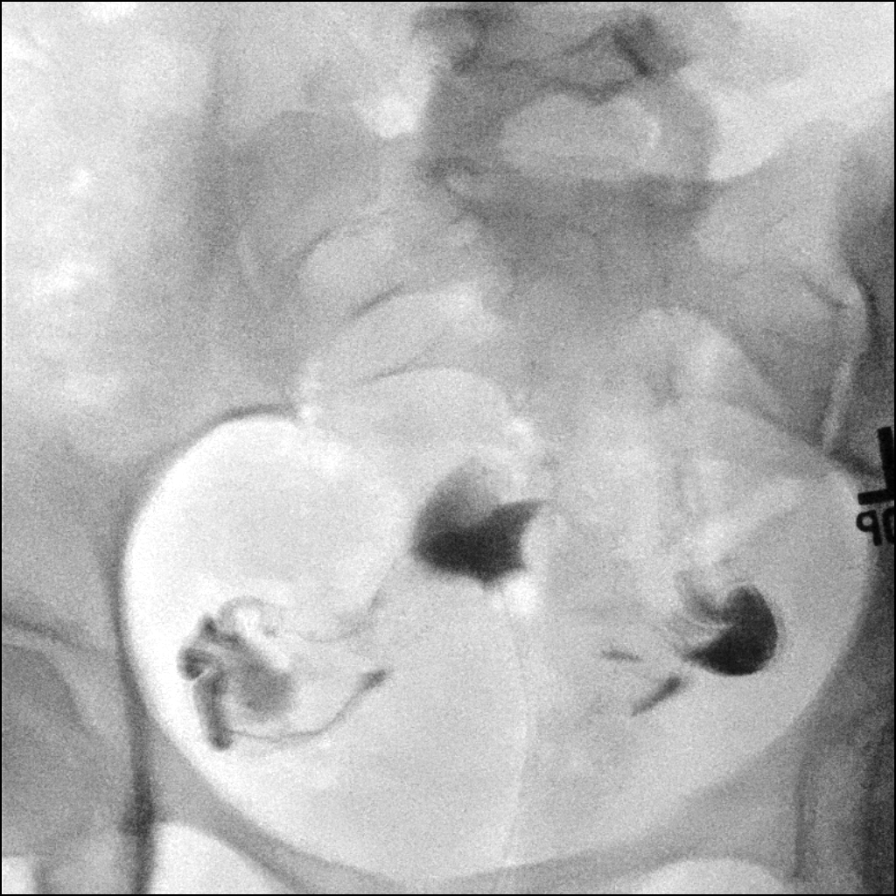

[7 of 7 positions shown; findings below may reference images not displayed]

FLUOROSCOPY TIME:  Radiation Exposure Index (as provided by the
fluoroscopic device): 1.8 mGy

If the device does not provide the exposure index:

Fluoroscopy Time:  24 seconds

Number of Acquired Images:  1
FINDINGS: The endometrial cavity is normal in appearance and contour. No signs
of mullerian duct anomaly.

Opacification of both fallopian tubes is seen. Both tubes appear
normal. Intraperitoneal spill of contrast from both fallopian tubes
is demonstrated.
IMPRESSION: Normal study. Both Fallopian tubes are patent.

## 2022-01-14 ENCOUNTER — Ambulatory Visit: Payer: Managed Care, Other (non HMO) | Attending: Physician Assistant | Admitting: Physical Therapy

## 2022-01-14 DIAGNOSIS — R252 Cramp and spasm: Secondary | ICD-10-CM | POA: Insufficient documentation

## 2022-01-14 DIAGNOSIS — M6281 Muscle weakness (generalized): Secondary | ICD-10-CM | POA: Insufficient documentation

## 2022-01-14 NOTE — Therapy (Signed)
OUTPATIENT PHYSICAL THERAPY FEMALE PELVIC EVALUATION   Patient Name: Mary Reilly MRN: [id number] DOB:[birth year], 26 y.o., female Today's Date: 01/15/2022   PT End of Session - 01/14/22 1418     Visit Number 1    Date for PT Re-Evaluation 04/08/22    Authorization Type cigna    PT Start Time 1400    PT Stop Time 1443    PT Time Calculation (min) 43 min    Activity Tolerance Patient tolerated treatment well    Behavior During Therapy WFL for tasks assessed/performed             Past Medical History:  Diagnosis Date   Allergy    Neuromuscular disorder (HCC)    Past Surgical History:  Procedure Laterality Date   KNEE ARTHROSCOPY W/ ACL RECONSTRUCTION     There are no problems to display for this patient.   PCP: Bryon Lions, PA-C  REFERRING PROVIDER: Purvis Sheffield, MD  REFERRING DIAG: [phone] (ICD-10-CM) - Myalgia, other site  THERAPY DIAG:  Cramp and spasm  Muscle weakness (generalized)  Rationale for Evaluation and Treatment Rehabilitation  ONSET DATE: Dec 2022 (that is when bladder spasms happened and then the pressure around the abdomen started  SUBJECTIVE:                                                                                                                                                                                           SUBJECTIVE STATEMENT: I had the endo pelvic pain most of my life, but after the surgeries are when things really started to get worse.  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 Yes NPRS scale: 1/10 (gets up to 5-6/10 off period; on period 9/10) Pain location:  low back wrapping around to the abdomen  Pain type: aching and nausea and fever Pain description: constant   Aggravating factors: menstrual cycle; night is worse after being on my feet Relieving factors: ibuprofen and  perkoset  PRECAUTIONS: None  WEIGHT BEARING RESTRICTIONS No  FALLS:  Has patient fallen in last 6 months? No  LIVING ENVIRONMENT: Lives with: lives with their spouse Lives in: House/apartment   OCCUPATION: PA  PLOF: Independent  PATIENT GOALS management of pain and be able to run without leakage  PERTINENT HISTORY:  Endometriosis Sexual abuse: No  BOWEL MOVEMENT Pain with bowel movement: Yes(when stool passes that spot in sigmoid colon) Type of bowel movement:Type (Bristol Stool Scale) normal, Frequency regular, and Strain No(rarely) Fully empty rectum:  Yes:   Leakage: No Pads: No   URINATION Pain with urination: Yes Fully empty bladder: No Stream: Strong Urgency: Yes:   Frequency: 2-3 times per 4 hours; 3-4x right before bed; nocturia 0-1x Leakage: Coughing, Sneezing, Exercise, and running and jumping Pads: No  INTERCOURSE Pain with intercourse:  depends on where in my cycle and deep penetration and more pressure on anterior wall Ability to have vaginal penetration:  Yes:   Climax: yes Marinoff Scale: 3/3  PREGNANCY none  PROLAPSE None    OBJECTIVE:   DIAGNOSTIC FINDINGS:  Laproscopy determined endo  PATIENT SURVEYS:    COGNITION:  Overall cognitive status: Within functional limits for tasks assessed     SENSATION:    MUSCLE LENGTH: Hamstrings: Right 80 deg; Left 80 deg   LUMBAR SPECIAL TESTS:    FUNCTIONAL TESTS:  Slight trendelenburg on the right side  GAIT:  Comments: wfl               POSTURE: rounded shoulders, increased thoracic kyphosis, and  mild scoliosis   PELVIC ALIGNMENT:  LUMBARAROM/PROM  A/PROM A/PROM  eval  Flexion 75%  Extension   Right lateral flexion   Left lateral flexion   Right rotation   Left rotation    (Blank rows = not tested)  LOWER EXTREMITY ROM:   WFL   LOWER EXTREMITY MMT:  MMT Right eval Left eval  Hip flexion    Hip extension    Hip abduction    Hip adduction    Hip internal  rotation    Hip external rotation 4+/5                             PALPATION:   General  thoracic and lumbar paraspinals                External Perineal Exam - elevated perineal body; Lt more tender than right cavernosis muscles, transverse peroneus, levators                             Internal Pelvic Floor levators tight and very tender Lt more than Rt unable to palpate beyond puborectalis due to severely tender  Patient confirms identification and approves PT to assess internal pelvic floor and treatment Yes  PELVIC MMT:   MMT eval  Vaginal 2/5 with hard time relaxing and bulging  Internal Anal Sphincter   External Anal Sphincter   Puborectalis   Diastasis Recti   (Blank rows = not tested)        TONE: high  PROLAPSE: none  TODAY'S TREATMENT  EVAL 01/15/22 - intial HEP   PATIENT EDUCATION:  Education details: Access Code: [email or web address] and wand info Person educated: Patient Education method: Explanation, Demonstration, Tactile cues, Verbal cues, and Handouts Education comprehension: verbalized understanding and returned demonstration   HOME EXERCISE PROGRAM: Access Code: [email or web address] URL: [email or web address] Date: 01/14/2022 Prepared by: Dwana Curd  Exercises - Happy Baby with Pelvic Floor Lengthening  - 1 x daily - 7 x weekly - 1 sets - 3 reps - 30 hold - Supine Butterfly Groin Stretch  - 1 x daily - 7 x weekly - 1 sets - 3 reps - 30 sec hold - Child's Pose with Thread the Needle  - 1 x daily - 7 x weekly - 3 sets - 10 reps  ASSESSMENT:  CLINICAL IMPRESSION: Patient is a 26 y.o. female who was seen  today for physical therapy evaluation and treatment for pelvic pain.  Pt has scoliosis and posture as noted above and mild Rt hip weakness.  Pt has notable pelvic floor tension throughout the pelvic floor as stated above.  Pt has had several abdominal procedures and had more pain since that time.  Pt has a high tone pelvic floor and muscle  spasm. She will benefit from skilled PT to address muscle tone and pain management strategies as well as all the above mentioned impairments.   OBJECTIVE IMPAIRMENTS decreased coordination, decreased endurance, decreased strength, increased fascial restrictions, increased muscle spasms, postural dysfunction, and pain.   ACTIVITY LIMITATIONS continence  PARTICIPATION LIMITATIONS: interpersonal relationship, community activity, and occupation  PERSONAL FACTORS 1-2 comorbidities: endometriosis and several surgeries this year  are also affecting patient's functional outcome.   REHAB POTENTIAL: Excellent  CLINICAL DECISION MAKING: Evolving/moderate complexity  EVALUATION COMPLEXITY: Low   GOALS: Goals reviewed with patient? Yes  SHORT TERM GOALS: Target date: 02/11/2022  Ind with using wand for pain managment Baseline: Goal status: INITIAL  2.  Ind with initial HEP for pain management Baseline:  Goal status: INITIAL   LONG TERM GOALS: Target date: 04/09/2022   Pt will be independent with advanced HEP to maintain improvements made throughout therapy  Baseline:  Goal status: INITIAL  2.  Pt will be able to functional actions such as running and jumping without leakage  Baseline:  Goal status: INITIAL  3.  Pt will report 75% reduction of pain due to improvements in posture, strength, and muscle length  Baseline:  Goal status: INITIAL  4.  Pt will have at least 2 ways to reduce pain when having a flare up so she can more fully participate in community and job Baseline:  Goal status: INITIAL  5.  Pt will be able to cough and sneeze without leakage Baseline:  Goal status: INITIAL    PLAN: PT FREQUENCY: 1x/week  PT DURATION: 12 weeks  PLANNED INTERVENTIONS: Therapeutic exercises, Therapeutic activity, Neuromuscular re-education, Balance training, Gait training, Patient/Family education, Joint mobilization, Dry Needling, Spinal mobilization, Cryotherapy, Moist heat,  Taping, Biofeedback, and Manual therapy  PLAN FOR NEXT SESSION: internal soft tissue and fascial release; lumbar and diaphragm release; ribcage release; breathing and rotation stretches   [employer], PT 01/15/2022, 7:55 AM

## 2022-01-15 ENCOUNTER — Encounter: Payer: Self-pay | Admitting: Physical Therapy

## 2022-01-18 NOTE — Therapy (Signed)
OUTPATIENT PHYSICAL THERAPY TREATMENT NOTE   Patient Name: Mary Reilly MRN: [id number] DOB:18-Feb-1996, 26 y.o., female Today's Date: 01/19/2022  PCP: Bryon Lions, PA-C   REFERRING PROVIDER: Purvis Sheffield, MD  END OF SESSION:    Past Medical History:  Diagnosis Date   Allergy    Neuromuscular disorder [facility])    Past Surgical History:  Procedure Laterality Date   KNEE ARTHROSCOPY W/ ACL RECONSTRUCTION     There are no problems to display for this patient.   REFERRING DIAG: M79.18 (ICD-10-CM) - Myalgia, other site  THERAPY DIAG:  No diagnosis found.  Rationale for Evaluation and Treatment Rehabilitation  PERTINENT HISTORY: Endometriosis  PRECAUTIONS: none  SUBJECTIVE: I am having some low back pain.  The stretches are helping especially the butterfly.  PAIN:  Are you having pain? Yes NPRS scale: 1/10 Pain location: low back Pain orientation: Medial and Lower  PAIN TYPE: dull Pain description: intermittent  Aggravating factors: wearing leggings for a while Relieving factors: the stretches    OBJECTIVE: (objective measures completed at initial evaluation unless otherwise dated)  OBJECTIVE:    DIAGNOSTIC FINDINGS:  Laproscopy determined endo   PATIENT SURVEYS:      COGNITION:            Overall cognitive status: Within functional limits for tasks assessed                          SENSATION:               MUSCLE LENGTH: Hamstrings: Right 80 deg; Left 80 deg     LUMBAR SPECIAL TESTS:      FUNCTIONAL TESTS:  Slight trendelenburg on the right side   GAIT:   Comments: wfl                POSTURE: rounded shoulders, increased thoracic kyphosis, and  mild scoliosis               PELVIC ALIGNMENT:   LUMBARAROM/PROM   A/PROM A/PROM  eval  Flexion 75%  Extension    Right lateral flexion    Left lateral flexion    Right rotation    Left rotation     (Blank rows = not tested)   LOWER EXTREMITY ROM:                          WFL     LOWER EXTREMITY MMT:   MMT Right eval Left eval  Hip flexion      Hip extension      Hip abduction      Hip adduction      Hip internal rotation      Hip external rotation 4+/5                                                 PALPATION:   General  thoracic and lumbar paraspinals                 External Perineal Exam - elevated perineal body; Lt more tender than right cavernosis muscles, transverse peroneus, levators                             Internal Pelvic Floor levators tight  and very tender Lt more than Rt unable to palpate beyond puborectalis due to severely tender   Patient confirms identification and approves PT to assess internal pelvic floor and treatment Yes   PELVIC MMT:   MMT eval  Vaginal 2/5 with hard time relaxing and bulging  Internal Anal Sphincter    External Anal Sphincter    Puborectalis    Diastasis Recti    (Blank rows = not tested)         TONE: high   PROLAPSE: none   TODAY'S TREATMENT  Treatment:01/19/22 Exercises  Foam roll under sacrum - Sktc and dktc with rocking Hip IR/ER stretch Half kneel rotation stretch The rock pose Hip elevated on wedge butterfly  Manual Lumbar and thoracic paraspinals Fascial release to abdomen, uterus and around ascending colon Trigger Point Dry-Needling  Treatment instructions: Expect mild to moderate muscle soreness. S/S of pneumothorax if dry needled over a lung field, and to seek immediate medical attention should they occur. Patient verbalized understanding of these instructions and education.  Patient Consent Given: Yes Education handout provided: Yes Muscles treated: lumbar and thoracic multifidi Electrical stimulation performed: No Parameters: N/A Treatment response/outcome: twitch and increased soft tissue  Nuero Re-ed Education and cues for coordination of breathing and pelvic floor muscle contracting and relaxing at appropriate times  Therapeutic activities   EVAL  01/15/22 - intial HEP     PATIENT EDUCATION:  Education details: Access Code: [email or web address] and wand info Person educated: Patient Education method: Explanation, Demonstration, Tactile cues, Verbal cues, and Handouts Education comprehension: verbalized understanding and returned demonstration     HOME EXERCISE PROGRAM: Access Code: [email or web address] URL: [email or web address] Date: 01/19/2022 Prepared by: Dwana Curd  Exercises - Happy Baby with Pelvic Floor Lengthening  - 1 x daily - 7 x weekly - 1 sets - 3 reps - 30 hold - Supine Butterfly Groin Stretch  - 1 x daily - 7 x weekly - 1 sets - 3 reps - 30 sec hold - Supine Hip Internal and External Rotation  - 1 x daily - 7 x weekly - 1 sets - 10 reps - 5 sec hold - World's Greatest Stretch - Lunge with Ipsilateral Thoracic Rotation  - 1 x daily - 7 x weekly - 3 sets - 10 reps - Rock  - 1 x daily - 7 x weekly - 3 sets - 10 reps  Patient Education - Trigger Point Dry Needling   ASSESSMENT:   CLINICAL IMPRESSION: Pt was feeling better with stretches previously provided and they were successful at reducing a lot of the back pain.  Pt ordered pelvic wand and will begin using but mostly likely will need some guidance on this at next session.  Today's session focused on pain management with soft tissue work and more stretches were added to the HEP.  She will continue to benefit from skilled PT to address muscle tone and pain management strategies as well as all the above mentioned impairments.     OBJECTIVE IMPAIRMENTS decreased coordination, decreased endurance, decreased strength, increased fascial restrictions, increased muscle spasms, postural dysfunction, and pain.    ACTIVITY LIMITATIONS continence   PARTICIPATION LIMITATIONS: interpersonal relationship, community activity, and occupation   PERSONAL FACTORS 1-2 comorbidities: endometriosis and several surgeries this year  are also affecting patient's functional outcome.     REHAB POTENTIAL: Excellent   CLINICAL DECISION MAKING: Evolving/moderate complexity   EVALUATION COMPLEXITY: Low     GOALS: Goals reviewed with patient? Yes   SHORT TERM GOALS: Target  date: 02/11/2022   Ind with using wand for pain managment Baseline: Goal status: ongoing   2.  Ind with initial HEP for pain management Baseline:  Goal status: met 01/19/22      LONG TERM GOALS: Target date: 04/09/2022    Pt will be independent with advanced HEP to maintain improvements made throughout therapy   Baseline:  Goal status: INITIAL   2.  Pt will be able to functional actions such as running and jumping without leakage   Baseline:  Goal status: INITIAL   3.  Pt will report 75% reduction of pain due to improvements in posture, strength, and muscle length   Baseline:  Goal status: INITIAL   4.  Pt will have at least 2 ways to reduce pain when having a flare up so she can more fully participate in community and job Baseline:  Goal status: INITIAL   5.  Pt will be able to cough and sneeze without leakage Baseline:  Goal status: INITIAL       PLAN: PT FREQUENCY: 1x/week   PT DURATION: 12 weeks   PLANNED INTERVENTIONS: Therapeutic exercises, Therapeutic activity, Neuromuscular re-education, Balance training, Gait training, Patient/Family education, Joint mobilization, Dry Needling, Spinal mobilization, Cryotherapy, Moist heat, Taping, Biofeedback, and Manual therapy   PLAN FOR NEXT SESSION: internal soft tissue and fascial release; f/u on pelvic wand and review how to use     [employer], PT 01/19/2022, 2:53 PM

## 2022-01-19 ENCOUNTER — Ambulatory Visit: Payer: Managed Care, Other (non HMO) | Admitting: Physical Therapy

## 2022-01-19 ENCOUNTER — Encounter: Payer: Self-pay | Admitting: Physical Therapy

## 2022-01-19 DIAGNOSIS — M6281 Muscle weakness (generalized): Secondary | ICD-10-CM

## 2022-01-19 DIAGNOSIS — R252 Cramp and spasm: Secondary | ICD-10-CM

## 2022-01-28 ENCOUNTER — Ambulatory Visit: Payer: Managed Care, Other (non HMO) | Admitting: Physical Therapy

## 2022-01-28 ENCOUNTER — Encounter: Payer: Self-pay | Admitting: Physical Therapy

## 2022-01-28 DIAGNOSIS — R252 Cramp and spasm: Secondary | ICD-10-CM | POA: Diagnosis not present

## 2022-01-28 DIAGNOSIS — M6281 Muscle weakness (generalized): Secondary | ICD-10-CM

## 2022-01-28 NOTE — Therapy (Signed)
OUTPATIENT PHYSICAL THERAPY TREATMENT NOTE   Patient Name: Mary Reilly MRN: [id number] DOB:[birth year], 26 y.o., female Today's Date: 01/28/2022  PCP: Loyola Mast, PA-C   REFERRING PROVIDER: Janece Canterbury, MD  END OF SESSION:   PT End of Session - 01/28/22 0811     Visit Number 3    Date for PT Re-Evaluation 04/08/22    Authorization Type cigna    PT Start Time 0804    PT Stop Time 0846    PT Time Calculation (min) 42 min    Activity Tolerance Patient tolerated treatment well    Behavior During Therapy [facility] for tasks assessed/performed             Past Medical History:  Diagnosis Date   Allergy    Neuromuscular disorder [facility])    Past Surgical History:  Procedure Laterality Date   KNEE ARTHROSCOPY W/ ACL RECONSTRUCTION     There are no problems to display for this patient.   REFERRING DIAG: M79.18 (ICD-10-CM) - Myalgia, other site  THERAPY DIAG:  Cramp and spasm  Muscle weakness (generalized)  Rationale for Evaluation and Treatment Rehabilitation  PERTINENT HISTORY: Endometriosis  PRECAUTIONS: none  SUBJECTIVE: I am having a period and wasn't supposed to.  So I have been cramping and hurting.  PAIN:  Are you having pain? Yes NPRS scale: 5/10 Pain location: uterus and radiates into my back Pain orientation: Medial and Lower  PAIN TYPE: dull Pain description: intermittent  Aggravating factors: period Relieving factors: the stretches    OBJECTIVE: (objective measures completed at initial evaluation unless otherwise dated)  OBJECTIVE:    DIAGNOSTIC FINDINGS:  Laproscopy determined endo   PATIENT SURVEYS:      COGNITION:            Overall cognitive status: Within functional limits for tasks assessed                          SENSATION:               MUSCLE LENGTH: Hamstrings: Right 80 deg; Left 80 deg     LUMBAR SPECIAL TESTS:      FUNCTIONAL TESTS:  Slight trendelenburg on the right side   GAIT:   Comments:  wfl                POSTURE: rounded shoulders, increased thoracic kyphosis, and  mild scoliosis               PELVIC ALIGNMENT:   LUMBARAROM/PROM   A/PROM A/PROM  eval  Flexion 75%  Extension    Right lateral flexion    Left lateral flexion    Right rotation    Left rotation     (Blank rows = not tested)   LOWER EXTREMITY ROM:                         WFL     LOWER EXTREMITY MMT:   MMT Right eval Left eval  Hip flexion      Hip extension      Hip abduction      Hip adduction      Hip internal rotation      Hip external rotation 4+/5  PALPATION:   General  thoracic and lumbar paraspinals                 External Perineal Exam - elevated perineal body; Lt more tender than right cavernosis muscles, transverse peroneus, levators                             Internal Pelvic Floor levators tight and very tender Lt more than Rt unable to palpate beyond puborectalis due to severely tender   Patient confirms identification and approves PT to assess internal pelvic floor and treatment Yes   PELVIC MMT:   MMT eval  Vaginal 2/5 with hard time relaxing and bulging  Internal Anal Sphincter    External Anal Sphincter    Puborectalis    Diastasis Recti    (Blank rows = not tested)         TONE: high   PROLAPSE: none   TODAY'S TREATMENT  Treatment:01/28/22 Exercises    Manual Patient confirms identification and approves physical therapist to perform internal soft tissue work  External around perineum fascial release left side more tension than right, perineal body and transverse peroneus internal and externally Lumbar and thoracic paraspinals, bilateral adductors  Trigger Point Dry-Needling  Treatment instructions: Expect mild to moderate muscle soreness. S/S of pneumothorax if dry needled over a lung field, and to seek immediate medical attention should they occur. Patient verbalized understanding of these  instructions and education.  Patient Consent Given: Yes Education handout provided: Yes Muscles treated: lumbar and thoracic multifidi Electrical stimulation performed: No Parameters: N/A Treatment response/outcome: twitch and increased soft tissue  Treatment:01/19/22 Exercises  Foam roll under sacrum - Sktc and dktc with rocking Hip IR/ER stretch Half kneel rotation stretch The rock pose Hip elevated on wedge butterfly  Manual Lumbar and thoracic paraspinals Fascial release to abdomen, uterus and around ascending colon Trigger Point Dry-Needling  Treatment instructions: Expect mild to moderate muscle soreness. S/S of pneumothorax if dry needled over a lung field, and to seek immediate medical attention should they occur. Patient verbalized understanding of these instructions and education.  Patient Consent Given: Yes Education handout provided: Yes Muscles treated: lumbar and thoracic multifidi Electrical stimulation performed: No Parameters: N/A Treatment response/outcome: twitch and increased soft tissue  Nuero Re-ed Education and cues for coordination of breathing and pelvic floor muscle contracting and relaxing at appropriate times  Therapeutic activities   EVAL 01/15/22 - intial HEP     PATIENT EDUCATION:  Education details: Access Code: [email or web address] and wand info Person educated: Patient Education method: Explanation, Demonstration, Tactile cues, Verbal cues, and Handouts Education comprehension: verbalized understanding and returned demonstration     HOME EXERCISE PROGRAM: Access Code: [email or web address] URL: [email or web address] Date: 01/19/2022 Prepared by: Jari Favre  Exercises - Happy Baby with Pelvic Floor Lengthening  - 1 x daily - 7 x weekly - 1 sets - 3 reps - 30 hold - Supine Butterfly Groin Stretch  - 1 x daily - 7 x weekly - 1 sets - 3 reps - 30 sec hold - Supine Hip Internal and External Rotation  - 1 x daily - 7 x weekly - 1 sets -  10 reps - 5 sec hold - World's Greatest Stretch - Lunge with Ipsilateral Thoracic Rotation  - 1 x daily - 7 x weekly - 3 sets - 10 reps - Rock  - 1 x daily - 7 x weekly - 3 sets - 10 reps  Patient  Education - Trigger Point Dry Needling   ASSESSMENT:   CLINICAL IMPRESSION: Pt has been having more pain from period cramps.  Pt responded well to fascial release with increased soft tissue length.  Dry needling also good response to muscle length along the spine.  Pt will continue to benefit from skilled PT to address soft tissue length of pelvic floor and postural muscle for improved pain management     OBJECTIVE IMPAIRMENTS decreased coordination, decreased endurance, decreased strength, increased fascial restrictions, increased muscle spasms, postural dysfunction, and pain.    ACTIVITY LIMITATIONS continence   PARTICIPATION LIMITATIONS: interpersonal relationship, community activity, and occupation   PERSONAL FACTORS 1-2 comorbidities: endometriosis and several surgeries this year  are also affecting patient's functional outcome.    REHAB POTENTIAL: Excellent   CLINICAL DECISION MAKING: Evolving/moderate complexity   EVALUATION COMPLEXITY: Low     GOALS: Goals reviewed with patient? Yes   SHORT TERM GOALS: Target date: 02/11/2022   Ind with using wand for pain managment Baseline: Goal status: ongoing   2.  Ind with initial HEP for pain management Baseline:  Goal status: met 01/19/22      LONG TERM GOALS: Target date: 04/09/2022    Pt will be independent with advanced HEP to maintain improvements made throughout therapy   Baseline:  Goal status: INITIAL   2.  Pt will be able to functional actions such as running and jumping without leakage   Baseline:  Goal status: INITIAL   3.  Pt will report 75% reduction of pain due to improvements in posture, strength, and muscle length   Baseline:  Goal status: INITIAL   4.  Pt will have at least 2 ways to reduce pain when  having a flare up so she can more fully participate in community and job Baseline:  Goal status: INITIAL   5.  Pt will be able to cough and sneeze without leakage Baseline:  Goal status: INITIAL       PLAN: PT FREQUENCY: 1x/week   PT DURATION: 12 weeks   PLANNED INTERVENTIONS: Therapeutic exercises, Therapeutic activity, Neuromuscular re-education, Balance training, Gait training, Patient/Family education, Joint mobilization, Dry Needling, Spinal mobilization, Cryotherapy, Moist heat, Taping, Biofeedback, and Manual therapy   PLAN FOR NEXT SESSION: internal soft tissue and fascial release progress; continue dry needling and bladder/uterine fascial release as needed; f/u on pelvic wand and review how to use; stretches updated for pelvic and spinal mobility     [employer], PT 01/28/2022, 8:47 AM

## 2022-02-08 NOTE — Therapy (Unsigned)
OUTPATIENT PHYSICAL THERAPY TREATMENT NOTE   Patient Name: Mary Reilly MRN: [id number] DOB:21-Dec-1995, 26 y.o., female Today's Date: 02/09/2022  PCP: Loyola Mast, PA-C   REFERRING PROVIDER: Janece Canterbury, MD  END OF SESSION:   PT End of Session - 02/09/22 1528     Visit Number 4    Date for PT Re-Evaluation 04/08/22    Authorization Type cigna    PT Start Time 1447    PT Stop Time 1528    PT Time Calculation (min) 41 min    Activity Tolerance Patient limited by fatigue    Behavior During Therapy [facility] for tasks assessed/performed              Past Medical History:  Diagnosis Date   Allergy    Neuromuscular disorder [facility])    Past Surgical History:  Procedure Laterality Date   KNEE ARTHROSCOPY W/ ACL RECONSTRUCTION     There are no problems to display for this patient.   REFERRING DIAG: M79.18 (ICD-10-CM) - Myalgia, other site  THERAPY DIAG:  Cramp and spasm  Muscle weakness (generalized)  Rationale for Evaluation and Treatment Rehabilitation  PERTINENT HISTORY: Endometriosis  PRECAUTIONS: none  SUBJECTIVE: I have had pain since that period two weeks ago. Now my hip hurts ever since today and it is more when weight bearing during my gait.  PAIN:  Are you having pain? Yes NPRS scale: 2/10 but more in push off during gait Pain location: lateral right hip Pain orientation: Lateral  PAIN TYPE: dull Pain description: intermittent  Aggravating factors: period Relieving factors: the stretches    OBJECTIVE: (objective measures completed at initial evaluation unless otherwise dated)  OBJECTIVE:    DIAGNOSTIC FINDINGS:  Laproscopy determined endo   PATIENT SURVEYS:      COGNITION:            Overall cognitive status: Within functional limits for tasks assessed                          SENSATION:               MUSCLE LENGTH: Hamstrings: Right 80 deg; Left 80 deg     LUMBAR SPECIAL TESTS:      FUNCTIONAL TESTS:  Slight  trendelenburg on the right side   GAIT:   Comments: wfl                POSTURE: rounded shoulders, increased thoracic kyphosis, and  mild scoliosis               PELVIC ALIGNMENT:   LUMBARAROM/PROM   A/PROM A/PROM  eval  Flexion 75%  Extension    Right lateral flexion    Left lateral flexion    Right rotation    Left rotation     (Blank rows = not tested)   LOWER EXTREMITY ROM:                         WFL     LOWER EXTREMITY MMT:   MMT Right eval Left eval  Hip flexion      Hip extension      Hip abduction      Hip adduction      Hip internal rotation      Hip external rotation 4+/5  PALPATION:   General  thoracic and lumbar paraspinals                 External Perineal Exam - elevated perineal body; Lt more tender than right cavernosis muscles, transverse peroneus, levators                             Internal Pelvic Floor levators tight and very tender Lt more than Rt unable to palpate beyond puborectalis due to severely tender   Patient confirms identification and approves PT to assess internal pelvic floor and treatment Yes   PELVIC MMT:   MMT eval  Vaginal 2/5 with hard time relaxing and bulging  Internal Anal Sphincter    External Anal Sphincter    Puborectalis    Diastasis Recti    (Blank rows = not tested)         TONE: high   PROLAPSE: none   TODAY'S TREATMENT  Treatment:02/09/22 Self care: foam noodle on pelvic floor; wand doing external massage Manual Patient confirms identification and approves physical therapist to perform internal soft tissue work  External around perineum fascial release left side more tension than right, perineal body and transverse peroneus internal and externally Internal to coccygeus bil and obdurator internus Lt side Lumbar and thoracic paraspinals, bilateral adductors  Trigger Point Dry-Needling  Treatment instructions: Expect mild to moderate muscle  soreness. S/S of pneumothorax if dry needled over a lung field, and to seek immediate medical attention should they occur. Patient verbalized understanding of these instructions and education.  Patient Consent Given: Yes Education handout provided: Yes Muscles treated: lumbar and thoracic multifidi Electrical stimulation performed: No Parameters: N/A Treatment response/outcome: twitch and increased soft tissue  Treatment:01/28/22 Exercises    Manual Patient confirms identification and approves physical therapist to perform internal soft tissue work  External around perineum fascial release left side more tension than right, perineal body and transverse peroneus internal and externally Lumbar and thoracic paraspinals, bilateral adductors  Trigger Point Dry-Needling  Treatment instructions: Expect mild to moderate muscle soreness. S/S of pneumothorax if dry needled over a lung field, and to seek immediate medical attention should they occur. Patient verbalized understanding of these instructions and education.  Patient Consent Given: Yes Education handout provided: Yes Muscles treated: lumbar and thoracic multifidi Electrical stimulation performed: No Parameters: N/A Treatment response/outcome: twitch and increased soft tissue     PATIENT EDUCATION:  Education details: Access Code: [email or web address] and wand info Person educated: Patient Education method: Explanation, Demonstration, Tactile cues, Verbal cues, and Handouts Education comprehension: verbalized understanding and returned demonstration     HOME EXERCISE PROGRAM: Access Code: [email or web address] URL: [email or web address] Date: 01/19/2022 Prepared by: Jari Favre  Exercises - Happy Baby with Pelvic Floor Lengthening  - 1 x daily - 7 x weekly - 1 sets - 3 reps - 30 hold - Supine Butterfly Groin Stretch  - 1 x daily - 7 x weekly - 1 sets - 3 reps - 30 sec hold - Supine Hip Internal and External Rotation  - 1 x  daily - 7 x weekly - 1 sets - 10 reps - 5 sec hold - World's Greatest Stretch - Lunge with Ipsilateral Thoracic Rotation  - 1 x daily - 7 x weekly - 3 sets - 10 reps - Rock  - 1 x daily - 7 x weekly - 3 sets - 10 reps  Patient Education - Trigger Point Dry Needling   ASSESSMENT:  CLINICAL IMPRESSION: Pt still having pain since she has had a period.  She was able to tolerate internal STM.  Pt did well with STM and got release .  Gave pt instructions on using wand to maintain improvements made during today's treatment.    OBJECTIVE IMPAIRMENTS decreased coordination, decreased endurance, decreased strength, increased fascial restrictions, increased muscle spasms, postural dysfunction, and pain.    ACTIVITY LIMITATIONS continence   PARTICIPATION LIMITATIONS: interpersonal relationship, community activity, and occupation   PERSONAL FACTORS 1-2 comorbidities: endometriosis and several surgeries this year  are also affecting patient's functional outcome.    REHAB POTENTIAL: Excellent   CLINICAL DECISION MAKING: Evolving/moderate complexity   EVALUATION COMPLEXITY: Low     GOALS: Goals reviewed with patient? Yes   SHORT TERM GOALS: Target date: 02/11/2022   Ind with using wand for pain managment Baseline: using externally Goal status: met 02/09/22    2.  Ind with initial HEP for pain management Baseline:  Goal status: met 01/19/22      LONG TERM GOALS: Target date: 04/09/2022    Pt will be independent with advanced HEP to maintain improvements made throughout therapy   Baseline:  Goal status: INITIAL   2.  Pt will be able to functional actions such as running and jumping without leakage   Baseline:  Goal status: INITIAL   3.  Pt will report 75% reduction of pain due to improvements in posture, strength, and muscle length   Baseline:  Goal status: INITIAL   4.  Pt will have at least 2 ways to reduce pain when having a flare up so she can more fully participate in  community and job Baseline:  Goal status: INITIAL   5.  Pt will be able to cough and sneeze without leakage Baseline:  Goal status: INITIAL       PLAN: PT FREQUENCY: 1x/week   PT DURATION: 12 weeks   PLANNED INTERVENTIONS: Therapeutic exercises, Therapeutic activity, Neuromuscular re-education, Balance training, Gait training, Patient/Family education, Joint mobilization, Dry Needling, Spinal mobilization, Cryotherapy, Moist heat, Taping, Biofeedback, and Manual therapy   PLAN FOR NEXT SESSION: internal soft tissue and fascial release progress; pelvic mobility hip shifts    Camillo Flaming Lovette Merta, PT 02/09/2022, 3:32 PM

## 2022-02-09 ENCOUNTER — Ambulatory Visit: Payer: Managed Care, Other (non HMO) | Attending: Physician Assistant | Admitting: Physical Therapy

## 2022-02-09 DIAGNOSIS — R252 Cramp and spasm: Secondary | ICD-10-CM | POA: Insufficient documentation

## 2022-02-09 DIAGNOSIS — M6281 Muscle weakness (generalized): Secondary | ICD-10-CM | POA: Diagnosis present

## 2022-03-02 ENCOUNTER — Ambulatory Visit: Payer: Managed Care, Other (non HMO) | Attending: Physician Assistant | Admitting: Physical Therapy

## 2022-03-02 DIAGNOSIS — R252 Cramp and spasm: Secondary | ICD-10-CM | POA: Insufficient documentation

## 2022-03-02 DIAGNOSIS — M6281 Muscle weakness (generalized): Secondary | ICD-10-CM | POA: Diagnosis present

## 2022-03-02 NOTE — Therapy (Signed)
OUTPATIENT PHYSICAL THERAPY TREATMENT NOTE   Patient Name: Mary Reilly MRN: [id number] DOB:02-05-1996, 26 y.o., female Today's Date: 03/02/2022  PCP: Loyola Mast, PA-C   REFERRING PROVIDER: Janece Canterbury, MD  END OF SESSION:   PT End of Session - 03/02/22 1442     Visit Number 5    Date for PT Re-Evaluation 04/08/22    Authorization Type cigna    PT Start Time 1357    PT Stop Time 1443    PT Time Calculation (min) 46 min    Activity Tolerance Patient limited by fatigue    Behavior During Therapy [facility] for tasks assessed/performed               Past Medical History:  Diagnosis Date   Allergy    Neuromuscular disorder [facility])    Past Surgical History:  Procedure Laterality Date   KNEE ARTHROSCOPY W/ ACL RECONSTRUCTION     There are no problems to display for this patient.   REFERRING DIAG: M79.18 (ICD-10-CM) - Myalgia, other site  THERAPY DIAG:  No diagnosis found.  Rationale for Evaluation and Treatment Rehabilitation  PERTINENT HISTORY: Endometriosis  PRECAUTIONS: none  SUBJECTIVE: I felt better the last few weeks.  Only woke from pain 3x.  My period is about to start so that I am expecting will set me back  PAIN:  Are you having pain? No     OBJECTIVE: (objective measures completed at initial evaluation unless otherwise dated)  OBJECTIVE:    DIAGNOSTIC FINDINGS:  Laproscopy determined endo   PATIENT SURVEYS:      COGNITION:            Overall cognitive status: Within functional limits for tasks assessed                          SENSATION:               MUSCLE LENGTH: Hamstrings: Right 80 deg; Left 80 deg     LUMBAR SPECIAL TESTS:      FUNCTIONAL TESTS:  Slight trendelenburg on the right side   GAIT:   Comments: wfl                POSTURE: rounded shoulders, increased thoracic kyphosis, and  mild scoliosis               PELVIC ALIGNMENT:   LUMBARAROM/PROM   A/PROM A/PROM  eval  Flexion 75%  Extension     Right lateral flexion    Left lateral flexion    Right rotation    Left rotation     (Blank rows = not tested)   LOWER EXTREMITY ROM:                         WFL     LOWER EXTREMITY MMT:   MMT Right eval Left eval  Hip flexion      Hip extension      Hip abduction      Hip adduction      Hip internal rotation      Hip external rotation 4+/5                                                 PALPATION:   General  thoracic and lumbar  paraspinals                 External Perineal Exam - elevated perineal body; Lt more tender than right cavernosis muscles, transverse peroneus, levators                             Internal Pelvic Floor levators tight and very tender Lt more than Rt unable to palpate beyond puborectalis due to severely tender   Patient confirms identification and approves PT to assess internal pelvic floor and treatment Yes   PELVIC MMT:   MMT eval  Vaginal 2/5 with hard time relaxing and bulging  Internal Anal Sphincter    External Anal Sphincter    Puborectalis    Diastasis Recti    (Blank rows = not tested)         TONE: high   PROLAPSE: none   TODAY'S TREATMENT  Treatment:03/02/22  Manual Patient confirms identification and approves physical therapist to perform internal soft tissue work  External around perineum fascial release left side more tension than right, perineal body and transverse peroneus internal and externally Internal to coccygeus muscles with LE movements Bilateral gluteals  Trigger Point Dry-Needling  Treatment instructions: Expect mild to moderate muscle soreness. S/S of pneumothorax if dry needled over a lung field, and to seek immediate medical attention should they occur. Patient verbalized understanding of these instructions and education.  Patient Consent Given: Yes Education handout provided: Yes Muscles treated: glute med, min, piriformis Electrical stimulation performed: No Parameters: N/A Treatment  response/outcome: twitch and increased soft tissue  Treatment:02/09/22 Self care: foam noodle on pelvic floor; wand doing external massage Manual Patient confirms identification and approves physical therapist to perform internal soft tissue work  External around perineum fascial release left side more tension than right, perineal body and transverse peroneus internal and externally Internal to coccygeus bil and obdurator internus Lt side Lumbar and thoracic paraspinals, bilateral adductors  Trigger Point Dry-Needling  Treatment instructions: Expect mild to moderate muscle soreness. S/S of pneumothorax if dry needled over a lung field, and to seek immediate medical attention should they occur. Patient verbalized understanding of these instructions and education.  Patient Consent Given: Yes Education handout provided: Yes Muscles treated: lumbar and thoracic multifidi Electrical stimulation performed: No Parameters: N/A Treatment response/outcome: twitch and increased soft tissue  Treatment:01/28/22 Exercises    Manual Patient confirms identification and approves physical therapist to perform internal soft tissue work  External around perineum fascial release left side more tension than right, perineal body and transverse peroneus internal and externally Lumbar and thoracic paraspinals, bilateral adductors  Trigger Point Dry-Needling  Treatment instructions: Expect mild to moderate muscle soreness. S/S of pneumothorax if dry needled over a lung field, and to seek immediate medical attention should they occur. Patient verbalized understanding of these instructions and education.  Patient Consent Given: Yes Education handout provided: Yes Muscles treated: lumbar and thoracic multifidi Electrical stimulation performed: No Parameters: N/A Treatment response/outcome: twitch and increased soft tissue     PATIENT EDUCATION:  Education details: Access Code: [email or web address] and wand  info Person educated: Patient Education method: Explanation, Demonstration, Tactile cues, Verbal cues, and Handouts Education comprehension: verbalized understanding and returned demonstration     HOME EXERCISE PROGRAM: Access Code: [email or web address] URL: [email or web address] Date: 01/19/2022 Prepared by: Jari Favre  Exercises - Happy Baby with Pelvic Floor Lengthening  - 1 x daily - 7 x weekly - 1 sets - 3 reps - 30  hold - Supine Butterfly Groin Stretch  - 1 x daily - 7 x weekly - 1 sets - 3 reps - 30 sec hold - Supine Hip Internal and External Rotation  - 1 x daily - 7 x weekly - 1 sets - 10 reps - 5 sec hold - World's Greatest Stretch - Lunge with Ipsilateral Thoracic Rotation  - 1 x daily - 7 x weekly - 3 sets - 10 reps - Rock  - 1 x daily - 7 x weekly - 3 sets - 10 reps  Patient Education - Trigger Point Dry Needling   ASSESSMENT:   CLINICAL IMPRESSION: Pt did well with STM today and had less pain that previous appointment.  Pt did well with release using LE movements during release of pubo and ileo coccygeus muscles.  She continues to have muscles spasms and is expected to continue to progress and needs skilled PT to address pelvic floor muscle spasms.   OBJECTIVE IMPAIRMENTS decreased coordination, decreased endurance, decreased strength, increased fascial restrictions, increased muscle spasms, postural dysfunction, and pain.    ACTIVITY LIMITATIONS continence   PARTICIPATION LIMITATIONS: interpersonal relationship, community activity, and occupation   PERSONAL FACTORS 1-2 comorbidities: endometriosis and several surgeries this year  are also affecting patient's functional outcome.    REHAB POTENTIAL: Excellent   CLINICAL DECISION MAKING: Evolving/moderate complexity   EVALUATION COMPLEXITY: Low     GOALS: Goals reviewed with patient? Yes   SHORT TERM GOALS: Target date: 02/11/2022   Ind with using wand for pain managment Baseline: using  externally Goal status: met 02/09/22    2.  Ind with initial HEP for pain management Baseline:  Goal status: met 01/19/22      LONG TERM GOALS: Target date: 04/09/2022    Pt will be independent with advanced HEP to maintain improvements made throughout therapy   Baseline:  Goal status: Ongoing   2.  Pt will be able to functional actions such as running and jumping without leakage   Baseline:  Goal status: Ongoing   3.  Pt will report 75% reduction of pain due to improvements in posture, strength, and muscle length   Baseline:  Goal status: Ongoing   4.  Pt will have at least 2 ways to reduce pain when having a flare up so she can more fully participate in community and job Baseline: is doing stretches and using the wand is helping Goal status: ongoing   5.  Pt will be able to cough and sneeze without leakage Baseline:  Goal status: Ongoing       PLAN: PT FREQUENCY: 1x/week   PT DURATION: 12 weeks   PLANNED INTERVENTIONS: Therapeutic exercises, Therapeutic activity, Neuromuscular re-education, Balance training, Gait training, Patient/Family education, Joint mobilization, Dry Needling, Spinal mobilization, Cryotherapy, Moist heat, Taping, Biofeedback, and Manual therapy   PLAN FOR NEXT SESSION: internal soft tissue and fascial release progress; pelvic mobility hip shifts    Camillo Flaming [place], PT 03/02/2022, 2:44 PM

## 2022-03-09 ENCOUNTER — Ambulatory Visit: Payer: Managed Care, Other (non HMO) | Admitting: Physical Therapy

## 2022-03-16 ENCOUNTER — Ambulatory Visit: Payer: Managed Care, Other (non HMO) | Admitting: Physical Therapy

## 2022-03-16 ENCOUNTER — Encounter: Payer: Self-pay | Admitting: Physical Therapy

## 2022-03-16 DIAGNOSIS — R252 Cramp and spasm: Secondary | ICD-10-CM | POA: Diagnosis not present

## 2022-03-16 DIAGNOSIS — M6281 Muscle weakness (generalized): Secondary | ICD-10-CM

## 2022-03-16 NOTE — Therapy (Signed)
OUTPATIENT PHYSICAL THERAPY TREATMENT NOTE   Patient Name: Mary Reilly MRN: [id number] DOB:11-28-1995, 26 y.o., female Today's Date: 03/16/2022  PCP: Loyola Mast, PA-C   REFERRING PROVIDER: Janece Canterbury, MD  END OF SESSION:   PT End of Session - 03/16/22 1409     Visit Number 6    Date for PT Re-Evaluation 04/08/22    Authorization Type cigna    PT Start Time 1406    PT Stop Time 1445    PT Time Calculation (min) 39 min    Activity Tolerance Patient limited by fatigue    Behavior During Therapy WFL for tasks assessed/performed                Past Medical History:  Diagnosis Date   Allergy    Neuromuscular disorder [facility])    Past Surgical History:  Procedure Laterality Date   KNEE ARTHROSCOPY W/ ACL RECONSTRUCTION     There are no problems to display for this patient.   REFERRING DIAG: M79.18 (ICD-10-CM) - Myalgia, other site  THERAPY DIAG:  Cramp and spasm  Muscle weakness (generalized)  Rationale for Evaluation and Treatment Rehabilitation  PERTINENT HISTORY: Endometriosis  PRECAUTIONS: none  SUBJECTIVE: I felt better since my period ended. Right now just piriformis and glutes and low abdomen a little sore  PAIN:  Are you having pain? No     OBJECTIVE: (objective measures completed at initial evaluation unless otherwise dated)  OBJECTIVE:    DIAGNOSTIC FINDINGS:  Laproscopy determined endo   PATIENT SURVEYS:      COGNITION:            Overall cognitive status: Within functional limits for tasks assessed                          SENSATION:               MUSCLE LENGTH: Hamstrings: Right 80 deg; Left 80 deg     LUMBAR SPECIAL TESTS:      FUNCTIONAL TESTS:  Slight trendelenburg on the right side   GAIT:   Comments: wfl                POSTURE: rounded shoulders, increased thoracic kyphosis, and  mild scoliosis               PELVIC ALIGNMENT:   LUMBARAROM/PROM   A/PROM A/PROM  eval  Flexion 75%   Extension    Right lateral flexion    Left lateral flexion    Right rotation    Left rotation     (Blank rows = not tested)   LOWER EXTREMITY ROM:                         WFL     LOWER EXTREMITY MMT:   MMT Right eval Left eval  Hip flexion      Hip extension      Hip abduction      Hip adduction      Hip internal rotation      Hip external rotation 4+/5                                                 PALPATION:   General  thoracic and lumbar paraspinals  External Perineal Exam - elevated perineal body; Lt more tender than right cavernosis muscles, transverse peroneus, levators                             Internal Pelvic Floor levators tight and very tender Lt more than Rt unable to palpate beyond puborectalis due to severely tender   Patient confirms identification and approves PT to assess internal pelvic floor and treatment Yes   PELVIC MMT:   MMT eval  Vaginal 2/5 with hard time relaxing and bulging  Internal Anal Sphincter    External Anal Sphincter    Puborectalis    Diastasis Recti    (Blank rows = not tested)         TONE: high   PROLAPSE: none   TODAY'S TREATMENT  Treatment:03/16/22  Manual Patient confirms identification and approves physical therapist to perform internal soft tissue work  Internal to ileococcygus (TTP bil and feeling some vaginal dryness today) pressure point with LE movements Bilateral gluteals and coccygeus positional with external hand placement  Trigger Point Dry-Needling  Treatment instructions: Expect mild to moderate muscle soreness. S/S of pneumothorax if dry needled over a lung field, and to seek immediate medical attention should they occur. Patient verbalized understanding of these instructions and education.  Patient Consent Given: Yes Education handout provided: Yes Muscles treated: glute med, min, piriformis bil Electrical stimulation performed: No Parameters: N/A Treatment  response/outcome: twitch and increased soft tissue  Treatment:03/02/22  Manual Patient confirms identification and approves physical therapist to perform internal soft tissue work  External around perineum fascial release left side more tension than right, perineal body and transverse peroneus internal and externally Internal to coccygeus muscles with LE movements Bilateral gluteals  Trigger Point Dry-Needling  Treatment instructions: Expect mild to moderate muscle soreness. S/S of pneumothorax if dry needled over a lung field, and to seek immediate medical attention should they occur. Patient verbalized understanding of these instructions and education.  Patient Consent Given: Yes Education handout provided: Yes Muscles treated: glute med, min, piriformis Electrical stimulation performed: No Parameters: N/A Treatment response/outcome: twitch and increased soft tissue  Treatment:02/09/22 Self care: foam noodle on pelvic floor; wand doing external massage Manual Patient confirms identification and approves physical therapist to perform internal soft tissue work  External around perineum fascial release left side more tension than right, perineal body and transverse peroneus internal and externally Internal to coccygeus bil and obdurator internus Lt side Lumbar and thoracic paraspinals, bilateral adductors  Trigger Point Dry-Needling  Treatment instructions: Expect mild to moderate muscle soreness. S/S of pneumothorax if dry needled over a lung field, and to seek immediate medical attention should they occur. Patient verbalized understanding of these instructions and education.  Patient Consent Given: Yes Education handout provided: Yes Muscles treated: lumbar and thoracic multifidi Electrical stimulation performed: No Parameters: N/A Treatment response/outcome: twitch and increased soft tissue      PATIENT EDUCATION:  Education details: Access Code: [email or web address] and wand  info Person educated: Patient Education method: Explanation, Demonstration, Tactile cues, Verbal cues, and Handouts Education comprehension: verbalized understanding and returned demonstration     HOME EXERCISE PROGRAM: Access Code: [email or web address] URL: [email or web address] Date: 01/19/2022 Prepared by: Jari Favre  Exercises - Happy Baby with Pelvic Floor Lengthening  - 1 x daily - 7 x weekly - 1 sets - 3 reps - 30 hold - Supine Butterfly Groin Stretch  - 1 x daily - 7 x weekly - 1 sets -  3 reps - 30 sec hold - Supine Hip Internal and External Rotation  - 1 x daily - 7 x weekly - 1 sets - 10 reps - 5 sec hold - World's Greatest Stretch - Lunge with Ipsilateral Thoracic Rotation  - 1 x daily - 7 x weekly - 3 sets - 10 reps - Rock  - 1 x daily - 7 x weekly - 3 sets - 10 reps  Patient Education - Trigger Point Dry Needling   ASSESSMENT:   CLINICAL IMPRESSION: Pt did well with STM today but a little more tender to ileococcygeus with more prominent trigger points bilaterally.  Pt had good muscle twitch response to glutes and piriformis with STM and dry needling techniques added.  Overall, she reports this was the least pain she has had with a period during this last one.  She continues to have muscles spasms and is expected to continue to progress and needs skilled PT to address pelvic floor muscle spasms.   OBJECTIVE IMPAIRMENTS decreased coordination, decreased endurance, decreased strength, increased fascial restrictions, increased muscle spasms, postural dysfunction, and pain.    ACTIVITY LIMITATIONS continence   PARTICIPATION LIMITATIONS: interpersonal relationship, community activity, and occupation   PERSONAL FACTORS 1-2 comorbidities: endometriosis and several surgeries this year  are also affecting patient's functional outcome.    REHAB POTENTIAL: Excellent   CLINICAL DECISION MAKING: Evolving/moderate complexity   EVALUATION COMPLEXITY: Low      GOALS: Goals reviewed with patient? Yes   SHORT TERM GOALS: Target date: 02/11/2022   Ind with using wand for pain managment Baseline: using externally Goal status: met 02/09/22    2.  Ind with initial HEP for pain management Baseline:  Goal status: met 01/19/22      LONG TERM GOALS: Target date: 04/09/2022    Pt will be independent with advanced HEP to maintain improvements made throughout therapy   Baseline:  Goal status: Ongoing   2.  Pt will be able to functional actions such as running and jumping without leakage   Baseline:  Goal status: Ongoing   3.  Pt will report 75% reduction of pain due to improvements in posture, strength, and muscle length   Baseline:  Goal status: Ongoing   4.  Pt will have at least 2 ways to reduce pain when having a flare up so she can more fully participate in community and job Baseline: is doing stretches and using the wand is helping Goal status: ongoing   5.  Pt will be able to cough and sneeze without leakage Baseline:  Goal status: Ongoing       PLAN: PT FREQUENCY: 1x/week   PT DURATION: 12 weeks   PLANNED INTERVENTIONS: Therapeutic exercises, Therapeutic activity, Neuromuscular re-education, Balance training, Gait training, Patient/Family education, Joint mobilization, Dry Needling, Spinal mobilization, Cryotherapy, Moist heat, Taping, Biofeedback, and Manual therapy   PLAN FOR NEXT SESSION: re-eval    Jule Ser, PT 03/16/2022, 4:02 PM

## 2022-04-07 ENCOUNTER — Encounter: Payer: Self-pay | Admitting: Physical Therapy

## 2022-04-07 ENCOUNTER — Ambulatory Visit: Payer: 59 | Attending: Physician Assistant | Admitting: Physical Therapy

## 2022-04-07 DIAGNOSIS — R252 Cramp and spasm: Secondary | ICD-10-CM | POA: Insufficient documentation

## 2022-04-07 DIAGNOSIS — M6281 Muscle weakness (generalized): Secondary | ICD-10-CM | POA: Insufficient documentation

## 2022-04-07 NOTE — Therapy (Signed)
OUTPATIENT PHYSICAL THERAPY TREATMENT NOTE   Patient Name: Mary Reilly MRN: [id number] DOB:[birth year], 26 y.o., female Today's Date: 04/07/2022  PCP: Loyola Mast, PA-C   REFERRING PROVIDER: Janece Canterbury, MD  END OF SESSION:   PT End of Session - 04/07/22 0807     Visit Number 7    Date for PT Re-Evaluation 06/30/22    Authorization Type cigna    PT Start Time 0805    PT Stop Time 0845    PT Time Calculation (min) 40 min    Activity Tolerance Patient limited by fatigue    Behavior During Therapy [facility] for tasks assessed/performed                 Past Medical History:  Diagnosis Date   Allergy    Neuromuscular disorder [facility])    Past Surgical History:  Procedure Laterality Date   KNEE ARTHROSCOPY W/ ACL RECONSTRUCTION     There are no problems to display for this patient.   REFERRING DIAG: M79.18 (ICD-10-CM) - Myalgia, other site  THERAPY DIAG:  Cramp and spasm  Muscle weakness (generalized)  Rationale for Evaluation and Treatment Rehabilitation  PERTINENT HISTORY: Endometriosis  PRECAUTIONS: none  SUBJECTIVE: This was the best period I have had, but not sure if that is because the lining was not as thick  PAIN:  Are you having pain? Yes NPRS scale: 2-3/10 Pain location: abdomen Pain orientation: Lower  PAIN TYPE: aching Pain description:  cramping   Aggravating factors: constipation and period Relieving factors:       OBJECTIVE: (objective measures completed at initial evaluation unless otherwise dated)  OBJECTIVE:    DIAGNOSTIC FINDINGS:  Laproscopy determined endo   PATIENT SURVEYS:      COGNITION:            Overall cognitive status: Within functional limits for tasks assessed                          SENSATION:               MUSCLE LENGTH: Hamstrings: Right 80 deg; Left 80 deg     LUMBAR SPECIAL TESTS:      FUNCTIONAL TESTS:  Slight trendelenburg on the right side   GAIT:   Comments: wfl                 POSTURE: rounded shoulders, increased thoracic kyphosis, and  mild scoliosis               PELVIC ALIGNMENT:   LUMBARAROM/PROM   A/PROM A/PROM  eval  Flexion 75%  Extension    Right lateral flexion    Left lateral flexion    Right rotation    Left rotation     (Blank rows = not tested)   LOWER EXTREMITY ROM:                         WFL     LOWER EXTREMITY MMT:   MMT Right eval Left eval  Hip flexion      Hip extension      Hip abduction      Hip adduction      Hip internal rotation      Hip external rotation 4+/5  PALPATION:   General  thoracic and lumbar paraspinals                 External Perineal Exam - elevated perineal body; Lt more tender than right cavernosis muscles, transverse peroneus, levators                             Internal Pelvic Floor levators tight and very tender Lt more than Rt unable to palpate beyond puborectalis due to severely tender   Patient confirms identification and approves PT to assess internal pelvic floor and treatment Yes   PELVIC MMT:   MMT eval  Vaginal 2/5 with hard time relaxing and bulging  Internal Anal Sphincter    External Anal Sphincter    Puborectalis    Diastasis Recti    (Blank rows = not tested)         TONE: high   PROLAPSE: none  Assessment 04/07/22: pelvic assessment and has tight and inflamed levator and ileococcygeus left; tight and tender on the Rt; left abdominal fascia restricted   TODAY'S TREATMENT   Treatment:04/07/22  Manual Patient confirms identification and approves physical therapist to perform internal soft tissue work  Internal to ileococcygus (TTP bil and feeling some vaginal dryness today) pressure point with LE movements Bilateral fascial release vaginal wall   Abdominal fascial release ascending and descending and sigmoid colon Self care: Squat technique for progression of gym  workouts  Treatment:03/16/22  Manual Patient confirms identification and approves physical therapist to perform internal soft tissue work  Internal to ileococcygus (TTP bil and feeling some vaginal dryness today) pressure point with LE movements Bilateral gluteals and coccygeus positional with external hand placement  Trigger Point Dry-Needling  Treatment instructions: Expect mild to moderate muscle soreness. S/S of pneumothorax if dry needled over a lung field, and to seek immediate medical attention should they occur. Patient verbalized understanding of these instructions and education.  Patient Consent Given: Yes Education handout provided: Yes Muscles treated: glute med, min, piriformis bil Electrical stimulation performed: No Parameters: N/A Treatment response/outcome: twitch and increased soft tissue  Treatment:03/02/22  Manual Patient confirms identification and approves physical therapist to perform internal soft tissue work  External around perineum fascial release left side more tension than right, perineal body and transverse peroneus internal and externally Internal to coccygeus muscles with LE movements Bilateral gluteals  Trigger Point Dry-Needling  Treatment instructions: Expect mild to moderate muscle soreness. S/S of pneumothorax if dry needled over a lung field, and to seek immediate medical attention should they occur. Patient verbalized understanding of these instructions and education.  Patient Consent Given: Yes Education handout provided: Yes Muscles treated: glute med, min, piriformis Electrical stimulation performed: No Parameters: N/A Treatment response/outcome: twitch and increased soft tissue    PATIENT EDUCATION:  Education details: Access Code: [email or web address] and wand info Person educated: Patient Education method: Explanation, Demonstration, Tactile cues, Verbal cues, and Handouts Education comprehension: verbalized understanding and returned  demonstration     HOME EXERCISE PROGRAM: Access Code: [email or web address] URL: [email or web address] Date: 01/19/2022 Prepared by: Jari Favre  Exercises - Happy Baby with Pelvic Floor Lengthening  - 1 x daily - 7 x weekly - 1 sets - 3 reps - 30 hold - Supine Butterfly Groin Stretch  - 1 x daily - 7 x weekly - 1 sets - 3 reps - 30 sec hold - Supine Hip Internal and External Rotation  - 1 x daily - 7 x weekly -  1 sets - 10 reps - 5 sec hold - World's Greatest Stretch - Lunge with Ipsilateral Thoracic Rotation  - 1 x daily - 7 x weekly - 3 sets - 10 reps - Rock  - 1 x daily - 7 x weekly - 3 sets - 10 reps  Patient Education - Trigger Point Dry Needling   ASSESSMENT:   CLINICAL IMPRESSION: Pt has been demonstrating improvements and met several goals. Due to the chronicity and severity of the endometriosis as well as her simultaneously doing fertility treatment, she will benefit from skilled PT to continue to safely progress strength and pain management.  She continues to have muscles spasms and is expected to continue to progress and needs skilled PT to address pelvic floor muscle spasms.   OBJECTIVE IMPAIRMENTS decreased coordination, decreased endurance, decreased strength, increased fascial restrictions, increased muscle spasms, postural dysfunction, and pain.    ACTIVITY LIMITATIONS continence   PARTICIPATION LIMITATIONS: interpersonal relationship, community activity, and occupation   PERSONAL FACTORS 1-2 comorbidities: endometriosis and several surgeries this year  are also affecting patient's functional outcome.    REHAB POTENTIAL: Excellent   CLINICAL DECISION MAKING: Evolving/moderate complexity   EVALUATION COMPLEXITY: Low     GOALS: Goals reviewed with patient? Yes   SHORT TERM GOALS: Target date: 02/11/2022   Ind with using wand for pain managment Baseline: using externally Goal status: met 02/09/22    2.  Ind with initial HEP for pain  management Baseline:  Goal status: met 01/19/22      LONG TERM GOALS: Target date: 06/30/2022 Updated 04/07/22     Pt will be independent with advanced HEP to maintain improvements made throughout therapy   Baseline:  Goal status: Ongoing   2.  Pt will be able to functional actions such as running and jumping without leakage   Baseline: running without leakage now Goal status: MET   3.  Pt will report 75% reduction of pain due to improvements in posture, strength, and muscle length   Baseline: baseline pain is almost gone and period last month was a lot better but still 5/10 and taking medicine Goal status: Partially met   4.  Pt will have at least 2 ways to reduce pain when having a flare up so she can more fully participate in community and job Baseline: is doing stretches and using the wand and foam rolling is helping Goal status: MET   5.  Pt will be able to cough and sneeze without leakage Baseline: able to run 3x the last two weeks, no leakage with cough or sneeze Goal status: MET 6. Pt will be able to do kettle bell workout without increased pain Baseline:  Goal status: INITIAL  7.  Pt will be able to tolerate fertility treatments with pain management and maintaining healthy activity levels Baseline:  Goal status: INITIAL         PLAN: PT FREQUENCY: 1x/week or biweekly   PT DURATION: 12 weeks   PLANNED INTERVENTIONS: Therapeutic exercises, Therapeutic activity, Neuromuscular re-education, Balance training, Gait training, Patient/Family education, Joint mobilization, Dry Needling, Spinal mobilization, Cryotherapy, Moist heat, Taping, Biofeedback, and Manual therapy   PLAN FOR NEXT SESSION:  f/u on returning to squats and kettle bell work; dead lifts; chest press; internal STM and trigger point release and abdominal fascial release as needed   [employer], PT 04/07/2022, 8:44 AM

## 2022-04-19 NOTE — Therapy (Unsigned)
OUTPATIENT PHYSICAL THERAPY TREATMENT NOTE   Patient Name: Mary Reilly MRN: [id number] DOB:22-Jun-1996, 26 y.o., female Today's Date: 04/20/2022  PCP: Loyola Mast, PA-C   REFERRING PROVIDER: Janece Canterbury, MD  END OF SESSION:   PT End of Session - 04/20/22 1404     Visit Number 8    Date for PT Re-Evaluation 06/30/22    Authorization Type cigna    PT Start Time 1403    PT Stop Time 1443    PT Time Calculation (min) 40 min    Activity Tolerance Patient limited by fatigue    Behavior During Therapy [facility] for tasks assessed/performed                  Past Medical History:  Diagnosis Date   Allergy    Neuromuscular disorder [facility])    Past Surgical History:  Procedure Laterality Date   KNEE ARTHROSCOPY W/ ACL RECONSTRUCTION     There are no problems to display for this patient.   REFERRING DIAG: M79.18 (ICD-10-CM) - Myalgia, other site  THERAPY DIAG:  Cramp and spasm  Muscle weakness (generalized)  Rationale for Evaluation and Treatment Rehabilitation  PERTINENT HISTORY: Endometriosis  PRECAUTIONS: none  SUBJECTIVE: I am not having pain today.  Just some soreness in my lumbar up between the shoulder blades. I did a workout and no pain  PAIN:  Are you having pain? No       OBJECTIVE: (objective measures completed at initial evaluation unless otherwise dated)  OBJECTIVE:    DIAGNOSTIC FINDINGS:  Laproscopy determined endo   PATIENT SURVEYS:      COGNITION:            Overall cognitive status: Within functional limits for tasks assessed                          SENSATION:               MUSCLE LENGTH: Hamstrings: Right 80 deg; Left 80 deg     LUMBAR SPECIAL TESTS:      FUNCTIONAL TESTS:  Slight trendelenburg on the right side   GAIT:   Comments: wfl                POSTURE: rounded shoulders, increased thoracic kyphosis, and  mild scoliosis               PELVIC ALIGNMENT:   LUMBARAROM/PROM   A/PROM A/PROM   eval  Flexion 75%  Extension    Right lateral flexion    Left lateral flexion    Right rotation    Left rotation     (Blank rows = not tested)   LOWER EXTREMITY ROM:                         WFL     LOWER EXTREMITY MMT:   MMT Right eval Left eval  Hip flexion      Hip extension      Hip abduction      Hip adduction      Hip internal rotation      Hip external rotation 4+/5                                                 PALPATION:  General  thoracic and lumbar paraspinals                 External Perineal Exam - elevated perineal body; Lt more tender than right cavernosis muscles, transverse peroneus, levators                             Internal Pelvic Floor levators tight and very tender Lt more than Rt unable to palpate beyond puborectalis due to severely tender   Patient confirms identification and approves PT to assess internal pelvic floor and treatment Yes   PELVIC MMT:   MMT eval  Vaginal 2/5 with hard time relaxing and bulging  Internal Anal Sphincter    External Anal Sphincter    Puborectalis    Diastasis Recti    (Blank rows = not tested)         TONE: high   PROLAPSE: none  Assessment 04/07/22: pelvic assessment and has tight and inflamed levator and ileococcygeus left; tight and tender on the Rt; left abdominal fascia restricted   TODAY'S TREATMENT  Treatment:04/20/22 Exercises: Side plank - with rotation Dead lift - 10lb x15 Step up/tap back 4" holding 5lb - 10x Single leg dead lift - 12 x each side Side step down heel tap 4" - 12 x Overhead 5 lb with march - 20x  Quadruped - UE and UE/LE reaches Hip flexor stretch vib plate Hamstring stretch vib plate   [id number]:11/06/40  Manual Patient confirms identification and approves physical therapist to perform internal soft tissue work  Internal to ileococcygus (TTP bil and feeling some vaginal dryness today) pressure point with LE movements Bilateral fascial release vaginal wall    Abdominal fascial release ascending and descending and sigmoid colon Self care: Squat technique for progression of gym workouts  Treatment:03/16/22  Manual Patient confirms identification and approves physical therapist to perform internal soft tissue work  Internal to ileococcygus (TTP bil and feeling some vaginal dryness today) pressure point with LE movements Bilateral gluteals and coccygeus positional with external hand placement  Trigger Point Dry-Needling  Treatment instructions: Expect mild to moderate muscle soreness. S/S of pneumothorax if dry needled over a lung field, and to seek immediate medical attention should they occur. Patient verbalized understanding of these instructions and education.  Patient Consent Given: Yes Education handout provided: Yes Muscles treated: glute med, min, piriformis bil Electrical stimulation performed: No Parameters: N/A Treatment response/outcome: twitch and increased soft tissue    PATIENT EDUCATION:  Education details: Access Code: [email or web address] and wand info Person educated: Patient Education method: Explanation, Demonstration, Tactile cues, Verbal cues, and Handouts Education comprehension: verbalized understanding and returned demonstration     HOME EXERCISE PROGRAM: Access Code: [email or web address] URL: [email or web address] Date: 01/19/2022 Prepared by: Jari Favre  Exercises - Happy Baby with Pelvic Floor Lengthening  - 1 x daily - 7 x weekly - 1 sets - 3 reps - 30 hold - Supine Butterfly Groin Stretch  - 1 x daily - 7 x weekly - 1 sets - 3 reps - 30 sec hold - Supine Hip Internal and External Rotation  - 1 x daily - 7 x weekly - 1 sets - 10 reps - 5 sec hold - World's Greatest Stretch - Lunge with Ipsilateral Thoracic Rotation  - 1 x daily - 7 x weekly - 3 sets - 10 reps - Rock  - 1 x daily - 7 x weekly - 3 sets - 10 reps  Patient Education -  Trigger Point Dry Needling   ASSESSMENT:   CLINICAL  IMPRESSION: Pt was able to focus on core and hip strength today. Pt had no increased pain.  Pt did well with some cues to keep back and pelvis neutral . Pt is now doing fertility treatments and management of pain is good the past 2 weeks.  Pt will benefit from skilled PT to continue to work on core and hip strength as well as pain management.   OBJECTIVE IMPAIRMENTS decreased coordination, decreased endurance, decreased strength, increased fascial restrictions, increased muscle spasms, postural dysfunction, and pain.    ACTIVITY LIMITATIONS continence   PARTICIPATION LIMITATIONS: interpersonal relationship, community activity, and occupation   PERSONAL FACTORS 1-2 comorbidities: endometriosis and several surgeries this year  are also affecting patient's functional outcome.    REHAB POTENTIAL: Excellent   CLINICAL DECISION MAKING: Evolving/moderate complexity   EVALUATION COMPLEXITY: Low     GOALS: Goals reviewed with patient? Yes   SHORT TERM GOALS: Target date: 02/11/2022   Ind with using wand for pain managment Baseline: using externally Goal status: met 02/09/22    2.  Ind with initial HEP for pain management Baseline:  Goal status: met 01/19/22      LONG TERM GOALS: Target date: 06/30/2022 Updated 04/20/22     Pt will be independent with advanced HEP to maintain improvements made throughout therapy   Baseline:  Goal status: Ongoing   2.  Pt will be able to functional actions such as running and jumping without leakage   Baseline: running without leakage now Goal status: MET   3.  Pt will report 75% reduction of pain due to improvements in posture, strength, and muscle length   Baseline: baseline pain is almost gone and period last month was a lot better but still 5/10 and taking medicine Goal status: Partially met   4.  Pt will have at least 2 ways to reduce pain when having a flare up so she can more fully participate in community and job Baseline: is doing  stretches and using the wand and foam rolling is helping Goal status: MET   5.  Pt will be able to cough and sneeze without leakage Baseline: able to run 3x the last two weeks, no leakage with cough or sneeze Goal status: MET 6. Pt will be able to do kettle bell workout without increased pain Baseline:  Goal status: IN PROGRESS  7.  Pt will be able to tolerate fertility treatments with pain management and maintaining healthy activity levels Baseline:  Goal status: IN PROGRESS         PLAN: PT FREQUENCY: 1x/week or biweekly   PT DURATION: 12 weeks   PLANNED INTERVENTIONS: Therapeutic exercises, Therapeutic activity, Neuromuscular re-education, Balance training, Gait training, Patient/Family education, Joint mobilization, Dry Needling, Spinal mobilization, Cryotherapy, Moist heat, Taping, Biofeedback, and Manual therapy   PLAN FOR NEXT SESSION:  f/u on adding more core strength; re-assess goals   Jule Ser, PT 04/20/2022, 2:45 PM

## 2022-04-20 ENCOUNTER — Ambulatory Visit: Payer: 59 | Admitting: Physical Therapy

## 2022-04-20 DIAGNOSIS — R252 Cramp and spasm: Secondary | ICD-10-CM | POA: Diagnosis not present

## 2022-04-20 DIAGNOSIS — M6281 Muscle weakness (generalized): Secondary | ICD-10-CM

## 2022-05-04 ENCOUNTER — Encounter: Payer: Self-pay | Admitting: Physical Therapy

## 2022-05-04 ENCOUNTER — Ambulatory Visit: Payer: 59 | Attending: Physician Assistant | Admitting: Physical Therapy

## 2022-05-04 DIAGNOSIS — M6281 Muscle weakness (generalized): Secondary | ICD-10-CM | POA: Insufficient documentation

## 2022-05-04 DIAGNOSIS — R252 Cramp and spasm: Secondary | ICD-10-CM | POA: Insufficient documentation

## 2022-05-04 NOTE — Therapy (Signed)
OUTPATIENT PHYSICAL THERAPY TREATMENT NOTE   Patient Name: Mary Reilly MRN: [id number] DOB:[birth year], 26 y.o., female Today's Date: 05/04/2022  PCP: Loyola Mast, PA-C   REFERRING PROVIDER: Janece Canterbury, MD  END OF SESSION:   PT End of Session - 05/04/22 1400     Visit Number 9    Date for PT Re-Evaluation 06/30/22    Authorization Type cigna    PT Start Time 1400    PT Stop Time 1444    PT Time Calculation (min) 44 min    Activity Tolerance Patient tolerated treatment well    Behavior During Therapy WFL for tasks assessed/performed                   Past Medical History:  Diagnosis Date   Allergy    Neuromuscular disorder [facility])    Past Surgical History:  Procedure Laterality Date   KNEE ARTHROSCOPY W/ ACL RECONSTRUCTION     There are no problems to display for this patient.   REFERRING DIAG: M79.18 (ICD-10-CM) - Myalgia, other site  THERAPY DIAG:  Cramp and spasm  Muscle weakness (generalized)  Rationale for Evaluation and Treatment Rehabilitation  PERTINENT HISTORY: Endometriosis, ( updated) [redacted] weeks pregnant   PRECAUTIONS: none  SUBJECTIVE: She has been little constipated but think it may be due to the  hormone injections. She stated that she was [redacted] weeks pregnant.  The doctor told her she can not do too much heavy lifting for two weeks. She has been able to run and reported no leakage. She has been very happy with her progress for PT  PAIN:  Are you having pain? No       OBJECTIVE: (objective measures completed at initial evaluation unless otherwise dated)  OBJECTIVE:    DIAGNOSTIC FINDINGS:  Laproscopy determined endo   PATIENT SURVEYS:      COGNITION:            Overall cognitive status: Within functional limits for tasks assessed                          SENSATION:               MUSCLE LENGTH: Hamstrings: Right 80 deg; Left 80 deg     LUMBAR SPECIAL TESTS:      FUNCTIONAL TESTS:  Slight  trendelenburg on the right side   GAIT:   Comments: wfl                POSTURE: rounded shoulders, increased thoracic kyphosis, and  mild scoliosis               PELVIC ALIGNMENT:   LUMBARAROM/PROM   A/PROM A/PROM  eval  Flexion 75%  Extension    Right lateral flexion    Left lateral flexion    Right rotation    Left rotation     (Blank rows = not tested)   LOWER EXTREMITY ROM:                         WFL     LOWER EXTREMITY MMT:   MMT Right eval Left eval  Hip flexion      Hip extension      Hip abduction      Hip adduction      Hip internal rotation      Hip external rotation 4+/5  PALPATION:   General  thoracic and lumbar paraspinals                 External Perineal Exam - elevated perineal body; Lt more tender than right cavernosis muscles, transverse peroneus, levators                             Internal Pelvic Floor levators tight and very tender Lt more than Rt unable to palpate beyond puborectalis due to severely tender   Patient confirms identification and approves PT to assess internal pelvic floor and treatment Yes   PELVIC MMT:   MMT eval  Vaginal 2/5 with hard time relaxing and bulging  Internal Anal Sphincter    External Anal Sphincter    Puborectalis    Diastasis Recti    (Blank rows = not tested)         TONE: high   PROLAPSE: none  Assessment 04/07/22: pelvic assessment and has tight and inflamed levator and ileococcygeus left; tight and tender on the Rt; left abdominal fascia restricted   TODAY'S TREATMENT   Treatment:05/04/2022 Exercises: Supine PPT 1x 10  Bridges 2x10  Bridge with [employer] postion hold  Arms up and legs 90/90 with taps Deadbug 1x 10 Side lying  Side plank bil 2x30 sec QPED Hover 4x10 sec holds  Alternating arms 5# flat weight to provide tactile cue  Alternating legs 5# flat weight for Tcs Bird dogs  Standing  Marching hold w/10# at  chest 1x10  Marching holding 10# over head 1x 10  Skater abd bil 1x 10  Skater hip ext 1x 10 bil  Pull down with marches 1 x12  Stretches Childs pose  Hamstring stretch on step 1x30 secs  Hip felxor stretch on the step    Treatment:04/20/22 Exercises: Side plank - with rotation Dead lift - 10lb x15 Step up/tap back 4" holding 5lb - 10x Single leg dead lift - 12 x each side Side step down heel tap 4" - 12 x Overhead 5 lb with march - 20x  Quadruped - UE and UE/LE reaches Hip flexor stretch vib plate Hamstring stretch vib plate   TDSKAJGOT:08/06/70  Manual Patient confirms identification and approves physical therapist to perform internal soft tissue work  Internal to ileococcygus (TTP bil and feeling some vaginal dryness today) pressure point with LE movements Bilateral fascial release vaginal wall   Abdominal fascial release ascending and descending and sigmoid colon Self care: Squat technique for progression of gym workouts    PATIENT EDUCATION:  Education details: Access Code: [email or web address] and wand info Person educated: Patient Education method: Explanation, Demonstration, Tactile cues, Verbal cues, and Handouts Education comprehension: verbalized understanding and returned demonstration     HOME EXERCISE PROGRAM: Access Code: [email or web address] URL: [email or web address] Date: 01/19/2022 Prepared by: Jari Favre  Exercises - Happy Baby with Pelvic Floor Lengthening  - 1 x daily - 7 x weekly - 1 sets - 3 reps - 30 hold - Supine Butterfly Groin Stretch  - 1 x daily - 7 x weekly - 1 sets - 3 reps - 30 sec hold - Supine Hip Internal and External Rotation  - 1 x daily - 7 x weekly - 1 sets - 10 reps - 5 sec hold - World's Greatest Stretch - Lunge with Ipsilateral Thoracic Rotation  - 1 x daily - 7 x weekly - 3 sets - 10 reps - Rock  - 1 x daily - 7 x  weekly - 3 sets - 10 reps  Patient Education - Trigger Point Dry Needling   ASSESSMENT:   CLINICAL  IMPRESSION: Pt responded well to therapy today. Today's session focused primarily on functional core and hip strengthening. Patient responded well to the tactile cue of a weight on her back during QPED exercises. Patient had great self awareness with body alignment with all activities. Pt demonstrated fatigue with more advance core strengthening exercises and need extended rest in between. Patient would benefit from skilled therapy to continue to work on core strengthening.    OBJECTIVE IMPAIRMENTS decreased coordination, decreased endurance, decreased strength, increased fascial restrictions, increased muscle spasms, postural dysfunction, and pain.    ACTIVITY LIMITATIONS continence   PARTICIPATION LIMITATIONS: interpersonal relationship, community activity, and occupation   PERSONAL FACTORS 1-2 comorbidities: endometriosis and several surgeries this year  are also affecting patient's functional outcome.    REHAB POTENTIAL: Excellent   CLINICAL DECISION MAKING: Evolving/moderate complexity   EVALUATION COMPLEXITY: Low     GOALS: Goals reviewed with patient? Yes   SHORT TERM GOALS: Target date: 02/11/2022   Ind with using wand for pain managment Baseline: using externally Goal status: met 02/09/22    2.  Ind with initial HEP for pain management Baseline:  Goal status: met 01/19/22      LONG TERM GOALS: Target date: 06/30/2022 Updated 04/20/22     Pt will be independent with advanced HEP to maintain improvements made throughout therapy   Baseline:  Goal status: Ongoing   2.  Pt will be able to functional actions such as running and jumping without leakage   Baseline: running without leakage now Goal status: MET   3.  Pt will report 75% reduction of pain due to improvements in posture, strength, and muscle length   Baseline: baseline pain is almost gone and period last month was a lot better but still 5/10 and taking medicine Goal status: Partially met   4.  Pt will  have at least 2 ways to reduce pain when having a flare up so she can more fully participate in community and job Baseline: is doing stretches and using the wand and foam rolling is helping Goal status: MET   5.  Pt will be able to cough and sneeze without leakage Baseline: able to run 3x the last two weeks, no leakage with cough or sneeze Goal status: MET 6. Pt will be able to do kettle bell workout without increased pain Baseline:  Goal status: IN PROGRESS  7.  Pt will be able to tolerate fertility treatments with pain management and maintaining healthy activity levels Baseline:  Goal status: IN PROGRESS         PLAN: PT FREQUENCY: 1x/week or biweekly   PT DURATION: 12 weeks   PLANNED INTERVENTIONS: Therapeutic exercises, Therapeutic activity, Neuromuscular re-education, Balance training, Gait training, Patient/Family education, Joint mobilization, Dry Needling, Spinal mobilization, Cryotherapy, Moist heat, Taping, Biofeedback, and Manual therapy   PLAN FOR NEXT SESSION:  f/u on how she felt after more advance core strengthening and re-eval    Jeffrey Voth, Student-PT 05/04/2022, 3:39 PM   I agree with the following treatment note after reviewing documentation. This session was performed under the supervision of a licensed clinician.  Gustavus Bryant, PT 05/04/22 5:05 PM

## 2022-05-18 ENCOUNTER — Ambulatory Visit: Payer: 59 | Admitting: Physical Therapy

## 2022-05-18 ENCOUNTER — Encounter: Payer: Self-pay | Admitting: Physical Therapy

## 2022-05-18 DIAGNOSIS — R252 Cramp and spasm: Secondary | ICD-10-CM | POA: Diagnosis not present

## 2022-05-18 DIAGNOSIS — M6281 Muscle weakness (generalized): Secondary | ICD-10-CM

## 2022-05-18 NOTE — Therapy (Deleted)
OUTPATIENT PHYSICAL THERAPY TREATMENT NOTE   Patient Name: Mary Reilly MRN: [id number] DOB:[birth year], 26 y.o., female Today's Date: 05/18/2022  PCP: Loyola Mast, PA-C   REFERRING PROVIDER: Janece Canterbury, MD  END OF SESSION:   PT End of Session - 05/18/22 1402     Visit Number 10    Date for PT Re-Evaluation 08/10/22    Authorization Type cigna    PT Start Time 1402    PT Stop Time 1447    PT Time Calculation (min) 45 min    Activity Tolerance Patient tolerated treatment well    Behavior During Therapy WFL for tasks assessed/performed                    Past Medical History:  Diagnosis Date   Allergy    Neuromuscular disorder [facility])    Past Surgical History:  Procedure Laterality Date   KNEE ARTHROSCOPY W/ ACL RECONSTRUCTION     There are no problems to display for this patient.   REFERRING DIAG: M79.18 (ICD-10-CM) - Myalgia, other site  THERAPY DIAG:  Cramp and spasm  Muscle weakness (generalized)  Rationale for Evaluation and Treatment Rehabilitation  PERTINENT HISTORY: Endometriosis, ( updated 05/04/2022) [redacted] weeks pregnant   PRECAUTIONS: none  SUBJECTIVE:  She has a catching this catching feeling in her hip/ glute when she is external rotate her leg. Lifted three times since last visit has be continuous running.  PAIN:  Are you having pain? No     OBJECTIVE: (objective measures completed at initial evaluation unless otherwise dated)  OBJECTIVE:    DIAGNOSTIC FINDINGS:  Laproscopy determined endo   PATIENT SURVEYS:      COGNITION:            Overall cognitive status: Within functional limits for tasks assessed                          SENSATION:               MUSCLE LENGTH: Hamstrings: Right 80 deg; Left 80 deg Updated: Hamstring: right 85 deg Left 80 deg     LUMBAR SPECIAL TESTS:      FUNCTIONAL TESTS:  Slight trendelenburg on the right side   GAIT:  Comments: wfl                POSTURE: rounded  shoulders, increased thoracic kyphosis, and  mild scoliosis               PELVIC ALIGNMENT:   LUMBARAROM/PROM   A/PROM A/PROM  eval 05/18/2022  Flexion 75%   Extension     Right lateral flexion     Left lateral flexion     Right rotation     Left rotation      (Blank rows = not tested)   LOWER EXTREMITY ROM:                         [facility]     LOWER EXTREMITY MMT:   MMT Right eval Left eval Right  05/18/2022 Left  05/18/2022  Hip flexion     5/5 5/5  Hip extension     5/5 5/5  Hip abduction     5/5 5/5  Hip adduction     5/5 5/5  Hip internal rotation     5/5 5/5  Hip external rotation 4+/5   5/5 5/5  PALPATION:   General  Updated: 05/18/2022 Adductor insertion tenderness                  External Perineal Exam - elevated perineal body; Lt more tender than right cavernosis muscles, transverse peroneus, levators                             Internal Pelvic Floor levators tight and very tender Lt more than Rt unable to palpate beyond puborectalis due to severely tender   Patient confirms identification and approves PT to assess internal pelvic floor and treatment Yes   PELVIC MMT:   MMT eval 05/18/2022  Vaginal 2/5 with hard time relaxing and bulging 3/5 6 quick flicks, 2 second holds   Internal Anal Sphincter     External Anal Sphincter     Puborectalis     Diastasis Recti     (Blank rows = not tested)         TONE: High Updated 05/18/2022 : normal tone    PROLAPSE: none  Assessment 04/07/22: pelvic assessment and has tight and inflamed levator and ileococcygeus left; tight and tender on the Rt; left abdominal fascia restricted   TODAY'S TREATMENT   Treatment:05/18/2022 Re-eval  Exercises: Supine Bridges 2x10  Bridge with [employer] postion hold  Arms up and legs 90/90 with  forward taps 3x4 bil Deadbug 1x 10 Side lying  Side plank bil 2x30 sec QPED Hover 4x10 sec holds   Alternating arms 5# flat weight to provide tactile cue  Alternating legs 5# flat weight for [employer]cs Bird dogs  Standing  Skater abd bil 1x 10  Skater hip ext 1x 10 bil  Stretches Childs pose    Treatment:05/04/2022 Exercises: Supine PPT 1x 10  Bridges 2x10  Bridge with [employer] postion hold  Arms up and legs 90/90 with taps Deadbug 1x 10 Side lying  Side plank bil 2x30 sec QPED Hover 4x10 sec holds  Alternating arms 5# flat weight to provide tactile cue  Alternating legs 5# flat weight for [employer] Bird dogs  Standing  Marching hold w/10# at chest 1x10  Marching holding 10# over head 1x 10  Skater abd bil 1x 10  Skater hip ext 1x 10 bil  Pull down with marches 1 x12  Stretches Childs pose  Hamstring stretch on step 1x30 secs  Hip flexor stretch on the step    Treatment:04/20/22 Exercises: Side plank - with rotation Dead lift - 10lb x15 Step up/tap back 4" holding 5lb - 10x Single leg dead lift - 12 x each side Side step down heel tap 4" - 12 x Overhead 5 lb with march - 20x  Quadruped - UE and UE/LE reaches Hip flexor stretch vib plate Hamstring stretch vib plate     PATIENT EDUCATION:  Education details: Access Code: [email or web address] and wand info Person educated: Patient Education method: Explanation, Demonstration, Tactile cues, Verbal cues, and Handouts Education comprehension: verbalized understanding and returned demonstration     HOME EXERCISE PROGRAM: Access Code: [email or web address] URL: [email or web address] Date: 01/19/2022 Prepared by: Jari Favre  Exercises - Happy Baby with Pelvic Floor Lengthening  - 1 x daily - 7 x weekly - 1 sets - 3 reps - 30 hold - Supine Butterfly Groin Stretch  - 1 x daily - 7 x weekly - 1 sets - 3 reps - 30 sec hold - Supine Hip Internal and External Rotation  - 1 x daily - 7  x weekly - 1 sets - 10 reps - 5 sec hold - World's Greatest Stretch - Lunge with Ipsilateral Thoracic Rotation  - 1 x daily - 7 x  weekly - 3 sets - 10 reps - Rock  - 1 x daily - 7 x weekly - 3 sets - 10 reps  Patient Education - Trigger Point Dry Needling   ASSESSMENT:   CLINICAL IMPRESSION: Pt responded well to therapy today to address pelvic floor coordination and strength deficits. Today's session focused primarily on reassessing goals and pelvic floor strength, functional core and hip strengthening. During the reassessment of the internal pelvic floor , pt presented with a 3/5 mmt , 6 quick flicks, and 2 second holds. Patient demonstrated an increase in strength and is still have a decrease in endurance. All goals were re-assessed and new goals added based on pt being pregnant currently.  Patient responded well to the tactile cue of a weight on her back during QPED exercises. Patient had great self awareness with body alignment with all activities. Pt demonstrated fatigue with more advance core strengthening exercises and need extended rest in between. Patient would benefit from continue skilled therapy to continue to address pelvic floor endurance and strength due to the patient now being pregnant and the pelvic will have to respond according the new changes.    OBJECTIVE IMPAIRMENTS decreased coordination, decreased endurance, decreased strength, increased fascial restrictions, increased muscle spasms, postural dysfunction, and pain.    ACTIVITY LIMITATIONS continence   PARTICIPATION LIMITATIONS: interpersonal relationship, community activity, and occupation   PERSONAL FACTORS 1-2 comorbidities: endometriosis and several surgeries this year  are also affecting patient's functional outcome.    REHAB POTENTIAL: Excellent   CLINICAL DECISION MAKING: Evolving/moderate complexity   EVALUATION COMPLEXITY: Low     GOALS: Goals reviewed with patient? Yes   SHORT TERM GOALS: Target date: 02/11/2022   Ind with using wand for pain managment Baseline: using externally Goal status: met 02/09/22    2.  Ind with  initial HEP for pain management Baseline:  Goal status: met 01/19/22      LONG TERM GOALS: Target date: 08/10/2022 Updated: 05/18/2022    Pt will be independent with advanced HEP to maintain improvements made throughout therapy   Baseline:  Goal status: Ongoing     2.  Pt will report 75% reduction of pain due to improvements in posture, strength, and muscle length   Baseline: baseline pain is almost gone and period last month was a lot better but still 5/10 and taking medicine Goal status: Partially met   3. Pt will be able to do kettle bell workout without increased pain Baseline:  Goal status: IN PROGRESS  4. Pt to demonstrate at least 4/5 pelvic floor strength for improved pelvic stability as she progress through her pregnancy  Baseline: 3/5 Goal status: INITIAL    5.  Pt will be able to hold  a pelvic floor contraction for 5 seconds to show an increase in pelvic floor endurance as will be beneficial as she progress through her pregnancy  Baseline:  Goal status: INITIAL  6.  Pt will be able to tolerate 1 hour of exercise throughout her pregnancy with pain management  Baseline:  Goal status: INITIAL       PLAN: PT FREQUENCY: 1x/week or biweekly   PT DURATION: 12 weeks   PLANNED INTERVENTIONS: Therapeutic exercises, Therapeutic activity, Neuromuscular re-education, Balance training, Gait training, Patient/Family education, Joint mobilization, Dry Needling, Spinal mobilization, Cryotherapy, Moist heat, Taping, Biofeedback,  and Manual therapy   PLAN FOR NEXT SESSION:  f/u on how she felt after more advance core strengthening   Ercell Razon, Student-PT 05/18/2022, 5:11 PM   I agree with the following treatment note after reviewing documentation. This session was performed under the supervision of a licensed clinician.  Gustavus Bryant, PT 05/18/22 5:11 PM

## 2022-05-18 NOTE — Therapy (Signed)
OUTPATIENT PHYSICAL THERAPY TREATMENT NOTE   Patient Name: Mary Reilly MRN: [id number] DOB:02-Oct-1995, 26 y.o., female Today's Date: 05/18/2022  PCP: Loyola Mast, PA-C   REFERRING PROVIDER: Janece Canterbury, MD  END OF SESSION:   PT End of Session - 05/18/22 1402     Visit Number 10    Date for PT Re-Evaluation 08/10/22    Authorization Type cigna    PT Start Time 1402    PT Stop Time 1447    PT Time Calculation (min) 45 min    Activity Tolerance Patient tolerated treatment well    Behavior During Therapy WFL for tasks assessed/performed                    Past Medical History:  Diagnosis Date   Allergy    Neuromuscular disorder [facility])    Past Surgical History:  Procedure Laterality Date   KNEE ARTHROSCOPY W/ ACL RECONSTRUCTION     There are no problems to display for this patient.   REFERRING DIAG: M79.18 (ICD-10-CM) - Myalgia, other site  THERAPY DIAG:  Cramp and spasm  Muscle weakness (generalized)  Rationale for Evaluation and Treatment Rehabilitation  PERTINENT HISTORY: Endometriosis, ( updated 05/04/2022) [redacted] weeks pregnant   PRECAUTIONS: none  SUBJECTIVE:  She has a catching this catching feeling in her hip/ glute when she is external rotate her leg. Lifted three times since last visit has be continuous running.  PAIN:  Are you having pain? No     OBJECTIVE: (objective measures completed at initial evaluation unless otherwise dated)  OBJECTIVE:    DIAGNOSTIC FINDINGS:  Laproscopy determined endo   PATIENT SURVEYS:      COGNITION:            Overall cognitive status: Within functional limits for tasks assessed                          SENSATION:               MUSCLE LENGTH: Hamstrings: Right 80 deg; Left 80 deg Updated 05/18/2022 : Hamstring: right 85 deg Left 80 deg     LUMBAR SPECIAL TESTS:      FUNCTIONAL TESTS:  Slight trendelenburg on the right side   GAIT:  Comments: wfl                 POSTURE: rounded shoulders, increased thoracic kyphosis, and  mild scoliosis               PELVIC ALIGNMENT:   LUMBARAROM/PROM   A/PROM A/PROM  eval 05/18/2022  Flexion 75%   Extension     Right lateral flexion     Left lateral flexion     Right rotation     Left rotation      (Blank rows = not tested)   LOWER EXTREMITY ROM:                         [facility]     LOWER EXTREMITY MMT:   MMT Right eval Left eval Right  05/18/2022 Left  05/18/2022  Hip flexion     5/5 5/5  Hip extension     5/5 5/5  Hip abduction     5/5 5/5  Hip adduction     5/5 5/5  Hip internal rotation     5/5 5/5  Hip external rotation 4+/5   5/5 5/5  PALPATION:   General  Updated: 05/18/2022 Adductor insertion tenderness                  External Perineal Exam - elevated perineal body; Lt more tender than right cavernosis muscles, transverse peroneus, levators                             Internal Pelvic Floor levators tight and very tender Lt more than Rt unable to palpate beyond puborectalis due to severely tender   Patient confirms identification and approves PT to assess internal pelvic floor and treatment Yes   PELVIC MMT:   MMT eval 05/18/2022  Vaginal 2/5 with hard time relaxing and bulging 3/5 6 quick flicks, 2 second holds   Internal Anal Sphincter     External Anal Sphincter     Puborectalis     Diastasis Recti     (Blank rows = not tested)         TONE: High Updated 05/18/2022 : normal tone    PROLAPSE: none  Assessment 04/07/22: pelvic assessment and has tight and inflamed levator and ileococcygeus left; tight and tender on the Rt; left abdominal fascia restricted   TODAY'S TREATMENT   Treatment:05/18/2022 Re-eval  Exercises: Supine Bridges 2x10  Bridge with [employer] postion hold  Arms up and legs 90/90 with  forward taps 3x4 bil Deadbug 1x 10 Side lying  Side plank bil 2x30 sec QPED Hover 4x10  sec holds  Alternating arms 5# flat weight to provide tactile cue  Alternating legs 5# flat weight for Tcs Bird dogs  Standing  Skater abd bil 1x 10  Skater hip ext 1x 10 bil  Stretches Childs pose    Treatment:05/04/2022 Exercises: Supine PPT 1x 10  Bridges 2x10  Bridge with [employer] postion hold  Arms up and legs 90/90 with taps Deadbug 1x 10 Side lying  Side plank bil 2x30 sec QPED Hover 4x10 sec holds  Alternating arms 5# flat weight to provide tactile cue  Alternating legs 5# flat weight for Tcs Bird dogs  Standing  Marching hold w/10# at chest 1x10  Marching holding 10# over head 1x 10  Skater abd bil 1x 10  Skater hip ext 1x 10 bil  Pull down with marches 1 x12  Stretches Childs pose  Hamstring stretch on step 1x30 secs  Hip flexor stretch on the step    Treatment:04/20/22 Exercises: Side plank - with rotation Dead lift - 10lb x15 Step up/tap back 4" holding 5lb - 10x Single leg dead lift - 12 x each side Side step down heel tap 4" - 12 x Overhead 5 lb with march - 20x  Quadruped - UE and UE/LE reaches Hip flexor stretch vib plate Hamstring stretch vib plate     PATIENT EDUCATION:  Education details: Access Code: [email or web address] and wand info Person educated: Patient Education method: Explanation, Demonstration, Tactile cues, Verbal cues, and Handouts Education comprehension: verbalized understanding and returned demonstration     HOME EXERCISE PROGRAM: Access Code: [email or web address] URL: [email or web address] Date: 01/19/2022 Prepared by: Jari Favre  Exercises - Happy Baby with Pelvic Floor Lengthening  - 1 x daily - 7 x weekly - 1 sets - 3 reps - 30 hold - Supine Butterfly Groin Stretch  - 1 x daily - 7 x weekly - 1 sets - 3 reps - 30 sec hold - Supine Hip Internal and External Rotation  - 1 x daily - 7  x weekly - 1 sets - 10 reps - 5 sec hold - World's Greatest Stretch - Lunge with Ipsilateral Thoracic Rotation  - 1 x  daily - 7 x weekly - 3 sets - 10 reps - Rock  - 1 x daily - 7 x weekly - 3 sets - 10 reps  Patient Education - Trigger Point Dry Needling   ASSESSMENT:   CLINICAL IMPRESSION: Pt responded well to therapy today to address pelvic floor coordination and strength deficits. Today's session focused primarily on reassessing goals and pelvic floor strength, functional core and hip strengthening. During the reassessment of the internal pelvic floor , pt presented with a 3/5 mmt , 6 quick flicks, and 2 second holds. Patient demonstrated an increase in strength and is still have a decrease in endurance. All goals were re-assessed and new goals added based on pt being pregnant currently.  Patient responded well to the tactile cue of a weight on her back during QPED exercises. Patient had great self awareness with body alignment with all activities. Pt demonstrated fatigue with more advance core strengthening exercises and need extended rest in between. Patient would benefit from continue skilled therapy to continue to address pelvic floor endurance and strength due to the patient now being pregnant and the pelvic will have to respond according the new changes.    OBJECTIVE IMPAIRMENTS decreased coordination, decreased endurance, decreased strength, increased fascial restrictions, increased muscle spasms, postural dysfunction, and pain.    ACTIVITY LIMITATIONS continence   PARTICIPATION LIMITATIONS: interpersonal relationship, community activity, and occupation   PERSONAL FACTORS 1-2 comorbidities: endometriosis and several surgeries this year  are also affecting patient's functional outcome.    REHAB POTENTIAL: Excellent   CLINICAL DECISION MAKING: Evolving/moderate complexity   EVALUATION COMPLEXITY: Low     GOALS: Goals reviewed with patient? Yes   SHORT TERM GOALS: Target date: 02/11/2022   Ind with using wand for pain managment Baseline: using externally Goal status: met 02/09/22    2.   Ind with initial HEP for pain management Baseline:  Goal status: met 01/19/22      LONG TERM GOALS: Target date: 08/10/2022 Updated: 05/18/2022    Pt will be independent with advanced HEP to maintain improvements made throughout therapy   Baseline:  Goal status: Ongoing     2.  Pt will report 75% reduction of pain due to improvements in posture, strength, and muscle length   Baseline: baseline pain is almost gone and period last month was a lot better but still 5/10 and taking medicine Goal status: Partially met   3. Pt will be able to do kettle bell workout without increased pain Baseline:  Goal status: IN PROGRESS  4. Pt to demonstrate at least 4/5 pelvic floor strength for improved pelvic stability as she progress through her pregnancy  Baseline: 3/5 Goal status: INITIAL    5.  Pt will be able to hold  a pelvic floor contraction for 5 seconds to show an increase in pelvic floor endurance as will be beneficial as she progress through her pregnancy  Baseline:  Goal status: INITIAL  6.  Pt will be able to tolerate 1 hour of exercise throughout her pregnancy with pain management  Baseline:  Goal status: INITIAL       PLAN: PT FREQUENCY: 1x/week or biweekly   PT DURATION: 12 weeks   PLANNED INTERVENTIONS: Therapeutic exercises, Therapeutic activity, Neuromuscular re-education, Balance training, Gait training, Patient/Family education, Joint mobilization, Dry Needling, Spinal mobilization, Cryotherapy, Moist heat, Taping, Biofeedback,  and Manual therapy   PLAN FOR NEXT SESSION:  f/u on how she felt after more advance core strengthening   Paisly Fingerhut, Student-PT 05/18/2022, 5:14 PM   I agree with the following treatment note after reviewing documentation. This session was performed under the supervision of a licensed clinician.  Gustavus Bryant, PT 05/18/22 5:14 PM

## 2022-06-04 ENCOUNTER — Ambulatory Visit: Payer: 59 | Attending: Physician Assistant | Admitting: Physical Therapy

## 2022-06-04 DIAGNOSIS — M6281 Muscle weakness (generalized): Secondary | ICD-10-CM | POA: Insufficient documentation

## 2022-06-04 DIAGNOSIS — R252 Cramp and spasm: Secondary | ICD-10-CM | POA: Insufficient documentation

## 2022-06-04 NOTE — Therapy (Signed)
OUTPATIENT PHYSICAL THERAPY TREATMENT NOTE   Patient Name: Mary Reilly MRN: [id number] DOB:10/05/1995, 26 y.o., female Today's Date: 06/04/2022  PCP: Loyola Mast, PA-C   REFERRING PROVIDER: Janece Canterbury, MD  END OF SESSION:   PT End of Session - 06/04/22 1100     Visit Number 11    Date for PT Re-Evaluation 08/10/22    Authorization Type cigna    PT Start Time [phone]   late   PT Stop Time 0846    PT Time Calculation (min) 35 min    Activity Tolerance Patient tolerated treatment well    Behavior During Therapy [facility] for tasks assessed/performed                     Past Medical History:  Diagnosis Date   Allergy    Neuromuscular disorder [facility])    Past Surgical History:  Procedure Laterality Date   KNEE ARTHROSCOPY W/ ACL RECONSTRUCTION     There are no problems to display for this patient.   REFERRING DIAG: M79.18 (ICD-10-CM) - Myalgia, other site  THERAPY DIAG:  Cramp and spasm  Muscle weakness (generalized)  Rationale for Evaluation and Treatment Rehabilitation  PERTINENT HISTORY: Endometriosis, ( updated 05/04/2022) [redacted] weeks pregnant   PRECAUTIONS: none  SUBJECTIVE:  She has been doing well.  Pain is almost non-existence and feeling good. I still feel my core is weak.  I am still doing shots in the butt until next week.  PAIN:  Are you having pain? No     OBJECTIVE: (objective measures completed at initial evaluation unless otherwise dated)  OBJECTIVE:    DIAGNOSTIC FINDINGS:  Laproscopy determined endo   PATIENT SURVEYS:      COGNITION:            Overall cognitive status: Within functional limits for tasks assessed                          SENSATION:               MUSCLE LENGTH: Hamstrings: Right 80 deg; Left 80 deg Updated 05/18/2022 : Hamstring: right 85 deg Left 80 deg     LUMBAR SPECIAL TESTS:      FUNCTIONAL TESTS:  Slight trendelenburg on the right side   GAIT:  Comments: wfl                 POSTURE: rounded shoulders, increased thoracic kyphosis, and  mild scoliosis               PELVIC ALIGNMENT:   LUMBARAROM/PROM   A/PROM A/PROM  eval 05/18/2022  Flexion 75%   Extension     Right lateral flexion     Left lateral flexion     Right rotation     Left rotation      (Blank rows = not tested)   LOWER EXTREMITY ROM:                         [facility]     LOWER EXTREMITY MMT:   MMT Right eval Left eval Right  05/18/2022 Left  05/18/2022  Hip flexion     5/5 5/5  Hip extension     5/5 5/5  Hip abduction     5/5 5/5  Hip adduction     5/5 5/5  Hip internal rotation     5/5 5/5  Hip external rotation 4+/5  5/5 5/5                                                           PALPATION:   General  Updated: 05/18/2022 Adductor insertion tenderness                  External Perineal Exam - elevated perineal body; Lt more tender than right cavernosis muscles, transverse peroneus, levators                             Internal Pelvic Floor levators tight and very tender Lt more than Rt unable to palpate beyond puborectalis due to severely tender   Patient confirms identification and approves PT to assess internal pelvic floor and treatment Yes   PELVIC MMT:   MMT eval 05/18/2022  Vaginal 2/5 with hard time relaxing and bulging 3/5 6 quick flicks, 2 second holds   Internal Anal Sphincter     External Anal Sphincter     Puborectalis     Diastasis Recti     (Blank rows = not tested)         TONE: High Updated 05/18/2022 : normal tone    PROLAPSE: none  Assessment 04/07/22: pelvic assessment and has tight and inflamed levator and ileococcygeus left; tight and tender on the Rt; left abdominal fascia restricted   TODAY'S TREATMENT   Treatment:06/04/2022 Exercises: Supine Bridges single leg 2 x 5 bil Bridge with marching blue loop - 2x 10 Deadbug on foam roll 1x 10 Hip abd with blue loop holding 5lb - 10x Standing  Step back lunge with UE rotation 7lb - 15 x  each side Anti-rotation squat - 7lb - 15 x each side Backwards walking 0.11mh x 4 min Glute med presses in wall - 5lb lifts 10x each side Single leg dead lift modified - 15x Stretches Childs pose - hip ER/IR - 3x each  Treatment:05/18/2022 Re-eval  Exercises: Supine Bridges 2x10  Bridge with [employer]ion hold  Arms up and legs 90/90 with  forward taps 3x4 bil Deadbug 1x 10 Side lying  Side plank bil 2x30 sec QPED Hover 4x10 sec holds  Alternating arms 5# flat weight to provide tactile cue  Alternating legs 5# flat weight for Tcs Bird dogs  Standing  Skater abd bil 1x 10  Skater hip ext 1x 10 bil  Stretches Childs pose    Treatment:05/04/2022 Exercises: Supine PPT 1x 10  Bridges 2x10  Bridge with [employer]postion hold  Arms up and legs 90/90 with taps Deadbug 1x 10  Standing  Marching hold w/10# at chest 1x10  Marching holding 10# over head 1x 10  Skater abd bil 1x 10  Skater hip ext 1x 10 bil  Pull down with marches 1 x12  Stretches Childs pose        PATIENT EDUCATION:  Education details: Access Code: [email or web address] and wDentistPerson educated: Patient Education method: EConsulting civil engineer DMedia planner TCorporate treasurercues, Verbal cues, and Handouts Education comprehension: verbalized understanding and returned demonstration     HOME EXERCISE PROGRAM: Access Code: [email or web address] URL: [email or web address] Date: 01/19/2022 Prepared by: JJari Favre Exercises - Happy Baby with Pelvic Floor Lengthening  - 1 x daily - 7 x weekly - 1 sets - 3  reps - 30 hold - Supine Butterfly Groin Stretch  - 1 x daily - 7 x weekly - 1 sets - 3 reps - 30 sec hold - Supine Hip Internal and External Rotation  - 1 x daily - 7 x weekly - 1 sets - 10 reps - 5 sec hold - World's Greatest Stretch - Lunge with Ipsilateral Thoracic Rotation  - 1 x daily - 7 x weekly - 3 sets - 10 reps - Rock  - 1 x daily - 7 x weekly - 3 sets - 10 reps  Patient  Education - Trigger Point Dry Needling   ASSESSMENT:   CLINICAL IMPRESSION: Pt did well with exercises and increased difficulty.  Pt needs minimal cues to keep pelvis in neutral with single leg standing.  Pt was given single leg dead lift to work on symmetry of hip and core strength as she demonstrates less stability on the Rt side.  Pt recommended to continue to address strength and work on pelvic mobility to help her maintain functional activities throughout pregnancy.   OBJECTIVE IMPAIRMENTS decreased coordination, decreased endurance, decreased strength, increased fascial restrictions, increased muscle spasms, postural dysfunction, and pain.    ACTIVITY LIMITATIONS continence   PARTICIPATION LIMITATIONS: interpersonal relationship, community activity, and occupation   PERSONAL FACTORS 1-2 comorbidities: endometriosis and several surgeries this year  are also affecting patient's functional outcome.    REHAB POTENTIAL: Excellent   CLINICAL DECISION MAKING: Evolving/moderate complexity   EVALUATION COMPLEXITY: Low     GOALS: Goals reviewed with patient? Yes   SHORT TERM GOALS: Target date: 02/11/2022   Ind with using wand for pain managment Baseline: using externally Goal status: met 02/09/22    2.  Ind with initial HEP for pain management Baseline:  Goal status: met 01/19/22      LONG TERM GOALS: Target date: 08/10/2022 Updated: 06/04/2022    Pt will be independent with advanced HEP to maintain improvements made throughout therapy   Baseline:  Goal status: Ongoing     2.  Pt will report 75% reduction of pain due to improvements in posture, strength, and muscle length   Baseline: no pain Goal status: MET   3. Pt will be able to do kettle bell workout without increased pain Baseline:  Goal status: IN PROGRESS  4. Pt to demonstrate at least 4/5 pelvic floor strength for improved pelvic stability as she progress through her pregnancy  Baseline: 3/5 Goal status:  In progress    5.  Pt will be able to hold  a pelvic floor contraction for 5 seconds to show an increase in pelvic floor endurance as will be beneficial as she progress through her pregnancy  Baseline:  Goal status: In progress    6.  Pt will be able to tolerate 1 hour of exercise throughout her pregnancy with pain management  Baseline:  Goal status: In progress         PLAN: PT FREQUENCY: 1x/week or biweekly   PT DURATION: 12 weeks   PLANNED INTERVENTIONS: Therapeutic exercises, Therapeutic activity, Neuromuscular re-education, Balance training, Gait training, Patient/Family education, Joint mobilization, Dry Needling, Spinal mobilization, Cryotherapy, Moist heat, Taping, Biofeedback, and Manual therapy   PLAN FOR NEXT SESSION:  f/u on single leg dead lift and progress as able; single leg and oblique strength; pelvic shifts for pelvic mobility stretches   Jakki L Marsden Zaino, PT 06/04/2022, 11:00 AM

## 2022-06-15 LAB — OB RESULTS CONSOLE GC/CHLAMYDIA
Chlamydia: NEGATIVE
Neisseria Gonorrhea: NEGATIVE

## 2022-06-15 LAB — OB RESULTS CONSOLE ABO/RH: RH Type: POSITIVE

## 2022-06-15 LAB — OB RESULTS CONSOLE HIV ANTIBODY (ROUTINE TESTING): HIV: NONREACTIVE

## 2022-06-15 LAB — OB RESULTS CONSOLE HEPATITIS B SURFACE ANTIGEN: Hepatitis B Surface Ag: NEGATIVE

## 2022-06-15 LAB — OB RESULTS CONSOLE RUBELLA ANTIBODY, IGM: Rubella: IMMUNE

## 2022-06-15 LAB — HEPATITIS C ANTIBODY: HCV Ab: NEGATIVE

## 2022-06-15 NOTE — Therapy (Unsigned)
OUTPATIENT PHYSICAL THERAPY TREATMENT NOTE   Patient Name: Mary Reilly MRN: [id number] DOB:April 23, 1996, 26 y.o., female Today's Date: 06/16/2022  PCP: Loyola Mast, PA-C   REFERRING PROVIDER: Janece Canterbury, MD  END OF SESSION:   PT End of Session - 06/16/22 0809     Visit Number 12    Date for PT Re-Evaluation 08/10/22    Authorization Type cigna    PT Start Time 0806    PT Stop Time 0844    PT Time Calculation (min) 38 min    Activity Tolerance Patient tolerated treatment well    Behavior During Therapy [facility] for tasks assessed/performed                      Past Medical History:  Diagnosis Date   Allergy    Neuromuscular disorder [facility])    Past Surgical History:  Procedure Laterality Date   KNEE ARTHROSCOPY W/ ACL RECONSTRUCTION     There are no problems to display for this patient.   REFERRING DIAG: M79.18 (ICD-10-CM) - Myalgia, other site  THERAPY DIAG:  Cramp and spasm  Muscle weakness (generalized)  Rationale for Evaluation and Treatment Rehabilitation  PERTINENT HISTORY: Endometriosis, ( updated 05/04/2022) [redacted] weeks pregnant   PRECAUTIONS: none  SUBJECTIVE:  I did a hard workout Monday and feeling it.  I am just feeling muscle soreness  PAIN:  Are you having pain? No     OBJECTIVE: (objective measures completed at initial evaluation unless otherwise dated)  OBJECTIVE:    DIAGNOSTIC FINDINGS:  Laproscopy determined endo   PATIENT SURVEYS:      COGNITION:            Overall cognitive status: Within functional limits for tasks assessed                          SENSATION:               MUSCLE LENGTH: Hamstrings: Right 80 deg; Left 80 deg Updated 05/18/2022 : Hamstring: right 85 deg Left 80 deg     LUMBAR SPECIAL TESTS:      FUNCTIONAL TESTS:  Slight trendelenburg on the right side   GAIT:  Comments: wfl                POSTURE: rounded shoulders, increased thoracic kyphosis, and  mild scoliosis                PELVIC ALIGNMENT:   LUMBARAROM/PROM   A/PROM A/PROM  eval 05/18/2022  Flexion 75%   Extension     Right lateral flexion     Left lateral flexion     Right rotation     Left rotation      (Blank rows = not tested)   LOWER EXTREMITY ROM:                         [facility]     LOWER EXTREMITY MMT:   MMT Right eval Left eval Right  05/18/2022 Left  05/18/2022  Hip flexion     5/5 5/5  Hip extension     5/5 5/5  Hip abduction     5/5 5/5  Hip adduction     5/5 5/5  Hip internal rotation     5/5 5/5  Hip external rotation 4+/5   5/5 5/5  PALPATION:   General  Updated: 05/18/2022 Adductor insertion tenderness                  External Perineal Exam - elevated perineal body; Lt more tender than right cavernosis muscles, transverse peroneus, levators                             Internal Pelvic Floor levators tight and very tender Lt more than Rt unable to palpate beyond puborectalis due to severely tender   Patient confirms identification and approves PT to assess internal pelvic floor and treatment Yes   PELVIC MMT:   MMT eval 05/18/2022  Vaginal 2/5 with hard time relaxing and bulging 3/5 6 quick flicks, 2 second holds   Internal Anal Sphincter     External Anal Sphincter     Puborectalis     Diastasis Recti     (Blank rows = not tested)         TONE: High Updated 05/18/2022 : normal tone    PROLAPSE: none  Assessment 04/07/22: pelvic assessment and has tight and inflamed levator and ileococcygeus left; tight and tender on the Rt; left abdominal fascia restricted   TODAY'S TREATMENT   Treatment:06/16/2022 Exercises: Supine Bridges single leg 2 x 5 bil Bridge with marching blue loop - 12x Deadbug holding 5lb - toe taps 20x Bridge marches with blue loop holding 5lb - 8x  Half kneel Rotation with hip shift Rotatoin with yellow band row Quadruped rotation  Standing  Glute med presses in  wall - 5lb lifts 10x each side Single leg dead lift modified diagonal pully 7lb - 15x bil BOSU step up - 10x bil BOSU step up yellow band row single arm - 10x bil BOSU side lunge - 20x bil Single leg dead lift with 7lb pulling up row - 15x bil Side step with pallof press yellow band - 15x bil  Stretches Piriformis figure 4 bil -  30 sec Happy baby - 30sec Butterfly - 30 sec  Sidelying Plank on knees - hold 3 sec x 10   Treatment:06/04/2022 Exercises: Supine Bridges single leg 2 x 5 bil Bridge with marching blue loop - 2x 10 Deadbug on foam roll 1x 10 Hip abd with blue loop holding 5lb - 10x Standing  Step back lunge with UE rotation 7lb - 15 x each side Anti-rotation squat - 7lb - 15 x each side Backwards walking 0.76mh x 4 min Glute med presses in wall - 5lb lifts 10x each side Single leg dead lift modified - 15x Stretches Childs pose - hip ER/IR - 3x each  Treatment:05/18/2022 Re-eval  Exercises: Supine Bridges 2x10  Bridge with mPublixpostion hold  Arms up and legs 90/90 with  forward taps 3x4 bil Deadbug 1x 10 Side lying  Side plank bil 2x30 sec QPED Hover 4x10 sec holds  Alternating arms 5# flat weight to provide tactile cue  Alternating legs 5# flat weight for Tcs Bird dogs  Standing  Skater abd bil 1x 10  Skater hip ext 1x 10 bil  Stretches Childs pose          PATIENT EDUCATION:  Education details: Access Code: [email or web address] and wDentistPerson educated: Patient Education method: Explanation, DMedia planner TCorporate treasurercues, Verbal cues, and Handouts Education comprehension: verbalized understanding and returned demonstration     HOME EXERCISE PROGRAM: Access Code: [email or web address] URL: [email or web address] Date: 01/19/2022 Prepared by: JJari Favre Exercises - Happy Baby  with Pelvic Floor Lengthening  - 1 x daily - 7 x weekly - 1 sets - 3 reps - 30 hold - Supine Butterfly Groin Stretch  - 1 x daily - 7 x weekly - 1  sets - 3 reps - 30 sec hold - Supine Hip Internal and External Rotation  - 1 x daily - 7 x weekly - 1 sets - 10 reps - 5 sec hold - World's Greatest Stretch - Lunge with Ipsilateral Thoracic Rotation  - 1 x daily - 7 x weekly - 3 sets - 10 reps - Rock  - 1 x daily - 7 x weekly - 3 sets - 10 reps  Patient Education - Trigger Point Dry Needling   ASSESSMENT:   CLINICAL IMPRESSION: Pt did well with exercises and increased variation of positions for greater pelvic mobility and strength.  Pt still having some decreased endurance with core exercises and needs guidance with doing more variety of positions needed for healthy pregnancy and labor and delivery.  Pt has some Pt recommended to continue to address strength and work on pelvic mobility to help her maintain functional activities throughout pregnancy.   OBJECTIVE IMPAIRMENTS decreased coordination, decreased endurance, decreased strength, increased fascial restrictions, increased muscle spasms, postural dysfunction, and pain.    ACTIVITY LIMITATIONS continence   PARTICIPATION LIMITATIONS: interpersonal relationship, community activity, and occupation   PERSONAL FACTORS 1-2 comorbidities: endometriosis and several surgeries this year  are also affecting patient's functional outcome.    REHAB POTENTIAL: Excellent   CLINICAL DECISION MAKING: Evolving/moderate complexity   EVALUATION COMPLEXITY: Low     GOALS: Goals reviewed with patient? Yes   SHORT TERM GOALS: Target date: 02/11/2022   Ind with using wand for pain managment Baseline: using externally Goal status: met 02/09/22    2.  Ind with initial HEP for pain management Baseline:  Goal status: met 01/19/22      LONG TERM GOALS: Target date: 08/10/2022 Updated: 06/04/2022    Pt will be independent with advanced HEP to maintain improvements made throughout therapy   Baseline:  Goal status: Ongoing     2.  Pt will report 75% reduction of pain due to improvements in  posture, strength, and muscle length   Baseline: no pain Goal status: MET   3. Pt will be able to do kettle bell workout without increased pain Baseline: just regular muscle soreness Goal status: MET  4. Pt to demonstrate at least 4/5 pelvic floor strength for improved pelvic stability as she progress through her pregnancy  Baseline: 3/5 Goal status: In progress    5.  Pt will be able to hold  a pelvic floor contraction for 5 seconds to show an increase in pelvic floor endurance as will be beneficial as she progress through her pregnancy  Baseline:  Goal status: In progress    6.  Pt will be able to tolerate 1 hour of exercise throughout her pregnancy with pain management  Baseline:  Goal status: In progress         PLAN: PT FREQUENCY: 1x/week or biweekly   PT DURATION: 12 weeks   PLANNED INTERVENTIONS: Therapeutic exercises, Therapeutic activity, Neuromuscular re-education, Balance training, Gait training, Patient/Family education, Joint mobilization, Dry Needling, Spinal mobilization, Cryotherapy, Moist heat, Taping, Biofeedback, and Manual therapy   PLAN FOR NEXT SESSION:  continue core and posture strength with pelvic mobility for functional movements with advancing pregancy   Camillo Flaming Marticia Reifschneider, PT 06/16/2022, 8:10 AM

## 2022-06-16 ENCOUNTER — Ambulatory Visit: Payer: 59 | Admitting: Physical Therapy

## 2022-06-16 ENCOUNTER — Encounter: Payer: Self-pay | Admitting: Physical Therapy

## 2022-06-16 DIAGNOSIS — R252 Cramp and spasm: Secondary | ICD-10-CM

## 2022-06-16 DIAGNOSIS — M6281 Muscle weakness (generalized): Secondary | ICD-10-CM

## 2022-06-22 LAB — OB RESULTS CONSOLE ANTIBODY SCREEN: Antibody Screen: NEGATIVE

## 2022-06-29 ENCOUNTER — Ambulatory Visit: Payer: 59 | Admitting: Physical Therapy

## 2022-06-29 ENCOUNTER — Encounter: Payer: Self-pay | Admitting: Physical Therapy

## 2022-06-29 DIAGNOSIS — R252 Cramp and spasm: Secondary | ICD-10-CM | POA: Diagnosis not present

## 2022-06-29 DIAGNOSIS — M6281 Muscle weakness (generalized): Secondary | ICD-10-CM

## 2022-06-29 NOTE — Therapy (Signed)
OUTPATIENT PHYSICAL THERAPY TREATMENT NOTE   Patient Name: Mary Reilly MRN: [id number] DOB:06-08-1996, 26 y.o., female Today's Date: 06/29/2022  PCP: Loyola Mast, PA-C   REFERRING PROVIDER: Janece Canterbury, MD  END OF SESSION:   PT End of Session - 06/29/22 0809     Visit Number 13    Date for PT Re-Evaluation 08/10/22    Authorization Type cigna    PT Start Time 0805    PT Stop Time 0845    PT Time Calculation (min) 40 min    Activity Tolerance Patient tolerated treatment well    Behavior During Therapy [facility] for tasks assessed/performed                       Past Medical History:  Diagnosis Date   Allergy    Neuromuscular disorder [facility])    Past Surgical History:  Procedure Laterality Date   KNEE ARTHROSCOPY W/ ACL RECONSTRUCTION     There are no problems to display for this patient.   REFERRING DIAG: M79.18 (ICD-10-CM) - Myalgia, other site  THERAPY DIAG:  Cramp and spasm  Muscle weakness (generalized)  Rationale for Evaluation and Treatment Rehabilitation  PERTINENT HISTORY: Endometriosis, ( updated 05/04/2022) [redacted] weeks pregnant   PRECAUTIONS: none  SUBJECTIVE:  I was sick last week.  I felt like a pulling in my upper abs when doing front squats PAIN:  Are you having pain? No     OBJECTIVE: (objective measures completed at initial evaluation unless otherwise dated)  OBJECTIVE:    DIAGNOSTIC FINDINGS:  Laproscopy determined endo   PATIENT SURVEYS:      COGNITION:            Overall cognitive status: Within functional limits for tasks assessed                          SENSATION:               MUSCLE LENGTH: Hamstrings: Right 80 deg; Left 80 deg Updated 05/18/2022 : Hamstring: right 85 deg Left 80 deg     LUMBAR SPECIAL TESTS:      FUNCTIONAL TESTS:  Slight trendelenburg on the right side   GAIT:  Comments: wfl                POSTURE: rounded shoulders, increased thoracic kyphosis, and  mild  scoliosis               PELVIC ALIGNMENT:   LUMBARAROM/PROM   A/PROM A/PROM  eval 05/18/2022  Flexion 75%   Extension     Right lateral flexion     Left lateral flexion     Right rotation     Left rotation      (Blank rows = not tested)   LOWER EXTREMITY ROM:                         [facility]     LOWER EXTREMITY MMT:   MMT Right eval Left eval Right  05/18/2022 Left  05/18/2022  Hip flexion     5/5 5/5  Hip extension     5/5 5/5  Hip abduction     5/5 5/5  Hip adduction     5/5 5/5  Hip internal rotation     5/5 5/5  Hip external rotation 4+/5   5/5 5/5  PALPATION:   General  Updated: 05/18/2022 Adductor insertion tenderness                  External Perineal Exam - elevated perineal body; Lt more tender than right cavernosis muscles, transverse peroneus, levators                             Internal Pelvic Floor levators tight and very tender Lt more than Rt unable to palpate beyond puborectalis due to severely tender   Patient confirms identification and approves PT to assess internal pelvic floor and treatment Yes   PELVIC MMT:   MMT eval 05/18/2022  Vaginal 2/5 with hard time relaxing and bulging 3/5 6 quick flicks, 2 second holds   Internal Anal Sphincter     External Anal Sphincter     Puborectalis     Diastasis Recti     (Blank rows = not tested)         TONE: High Updated 05/18/2022 : normal tone    PROLAPSE: none  Assessment 04/07/22: pelvic assessment and has tight and inflamed levator and ileococcygeus left; tight and tender on the Rt; left abdominal fascia restricted   TODAY'S TREATMENT   Treatment:06/29/2022 Exercises: Supine 8lb overhead in 90-90 LE position - 5 x 10 sec Shoulder press 8lb - 2 x 10 Alt UE 8lb - LE in 90-90 position - 20x Alt UE to opposite LE - dead bugs - 20x  Standing   BOSU side and forward lunges holding 8lb - 12x bil Single leg dead lift with 8lb 20 x  bil Side step with pallof press yellow band - 15x bil  Stretches Piriformis figure 4 bil -  30 sec Happy baby - 30sec Butterfly - 30 sec Open book 10x each side Child pose rotation - 10 deep breaths on each side  Sidelying Plank on knees - hip abduction - 10x bil Manual  Bilateral gluteal trigger point release and massage gun - glute meds     PATIENT EDUCATION:  Education details: Access Code: [email or web address] and wand info Person educated: Patient Education method: Explanation, Demonstration, Tactile cues, Verbal cues, and Handouts Education comprehension: verbalized understanding and returned demonstration     HOME EXERCISE PROGRAM: Access Code: [email or web address] URL: [email or web address] Date: 01/19/2022 Prepared by: Jari Favre  Exercises - Happy Baby with Pelvic Floor Lengthening  - 1 x daily - 7 x weekly - 1 sets - 3 reps - 30 hold - Supine Butterfly Groin Stretch  - 1 x daily - 7 x weekly - 1 sets - 3 reps - 30 sec hold - Supine Hip Internal and External Rotation  - 1 x daily - 7 x weekly - 1 sets - 10 reps - 5 sec hold - World's Greatest Stretch - Lunge with Ipsilateral Thoracic Rotation  - 1 x daily - 7 x weekly - 3 sets - 10 reps - Rock  - 1 x daily - 7 x weekly - 3 sets - 10 reps  Patient Education - Trigger Point Dry Needling   ASSESSMENT:   CLINICAL IMPRESSION: Pt needed minimal cues to keep ribcage down with supine core strengthening exercises.  Pt has good technique with single leg and step up exercises.  Her gluteal muscle fatigued and were tight at the end of session.  Pt responded well to manual techniques to release the gluteals and had increased soft tissue length especially through glute medius.  Pt will benefit from skilled  PT to work on final HEP for continueing activities as part of healthy pregnancy.   OBJECTIVE IMPAIRMENTS decreased coordination, decreased endurance, decreased strength, increased fascial restrictions, increased muscle  spasms, postural dysfunction, and pain.    ACTIVITY LIMITATIONS continence   PARTICIPATION LIMITATIONS: interpersonal relationship, community activity, and occupation   PERSONAL FACTORS 1-2 comorbidities: endometriosis and several surgeries this year  are also affecting patient's functional outcome.    REHAB POTENTIAL: Excellent   CLINICAL DECISION MAKING: Evolving/moderate complexity   EVALUATION COMPLEXITY: Low     GOALS: Goals reviewed with patient? Yes   SHORT TERM GOALS: Target date: 02/11/2022   Ind with using wand for pain managment Baseline: using externally Goal status: met 02/09/22    2.  Ind with initial HEP for pain management Baseline:  Goal status: met 01/19/22      LONG TERM GOALS: Target date: 08/10/2022 Updated: 06/04/2022    Pt will be independent with advanced HEP to maintain improvements made throughout therapy   Baseline:  Goal status: Ongoing     2.  Pt will report 75% reduction of pain due to improvements in posture, strength, and muscle length   Baseline: no pain Goal status: MET   3. Pt will be able to do kettle bell workout without increased pain Baseline: just regular muscle soreness Goal status: MET  4. Pt to demonstrate at least 4/5 pelvic floor strength for improved pelvic stability as she progress through her pregnancy  Baseline: 3/5 Goal status: In progress    5.  Pt will be able to hold  a pelvic floor contraction for 5 seconds to show an increase in pelvic floor endurance as will be beneficial as she progress through her pregnancy  Baseline:  Goal status: In progress    6.  Pt will be able to tolerate 1 hour of exercise throughout her pregnancy with pain management  Baseline:  Goal status: In progress         PLAN: PT FREQUENCY: 1x/week or biweekly   PT DURATION: 12 weeks   PLANNED INTERVENTIONS: Therapeutic exercises, Therapeutic activity, Neuromuscular re-education, Balance training, Gait training, Patient/Family  education, Joint mobilization, Dry Needling, Spinal mobilization, Cryotherapy, Moist heat, Taping, Biofeedback, and Manual therapy   PLAN FOR NEXT SESSION:  finalize HEP for discharge; core and posture strength with pelvic mobility for functional movements with advancing pregancy  Camillo Flaming Myrla Malanowski, PT 06/29/2022, 8:09 AM

## 2022-07-13 ENCOUNTER — Ambulatory Visit: Payer: 59 | Attending: Student | Admitting: Physical Therapy

## 2022-07-13 ENCOUNTER — Encounter: Payer: Self-pay | Admitting: Physical Therapy

## 2022-07-13 DIAGNOSIS — M6281 Muscle weakness (generalized): Secondary | ICD-10-CM | POA: Insufficient documentation

## 2022-07-13 DIAGNOSIS — R252 Cramp and spasm: Secondary | ICD-10-CM | POA: Insufficient documentation

## 2022-07-13 NOTE — Therapy (Signed)
OUTPATIENT PHYSICAL THERAPY TREATMENT NOTE   Patient Name: Mary Reilly MRN: [id number] DOB:[birth year], 26 y.o., female Today's Date: 07/13/2022  PCP: Loyola Mast, PA-C   REFERRING PROVIDER: Janece Canterbury, MD  END OF SESSION:   PT End of Session - 07/13/22 1451     Visit Number 14    Date for PT Re-Evaluation 08/10/22    Authorization Type cigna    PT Start Time 1450    PT Stop Time 1530    PT Time Calculation (min) 40 min    Activity Tolerance Patient tolerated treatment well    Behavior During Therapy WFL for tasks assessed/performed                        Past Medical History:  Diagnosis Date   Allergy    Neuromuscular disorder [facility])    Past Surgical History:  Procedure Laterality Date   KNEE ARTHROSCOPY W/ ACL RECONSTRUCTION     There are no problems to display for this patient.   REFERRING DIAG: M79.18 (ICD-10-CM) - Myalgia, other site  THERAPY DIAG:  Cramp and spasm  Muscle weakness (generalized)  Rationale for Evaluation and Treatment Rehabilitation  PERTINENT HISTORY: Endometriosis, ( updated 05/04/2022) [redacted] weeks pregnant   PRECAUTIONS: none  SUBJECTIVE:  I am still sore in the piriformis but stretches and foam rolling is helping. PAIN:  Are you having pain? No     OBJECTIVE: (objective measures completed at initial evaluation unless otherwise dated)  OBJECTIVE:    DIAGNOSTIC FINDINGS:  Laproscopy determined endo   PATIENT SURVEYS:      COGNITION:            Overall cognitive status: Within functional limits for tasks assessed                          SENSATION:               MUSCLE LENGTH: Hamstrings: Right 80 deg; Left 80 deg Updated 05/18/2022 : Hamstring: right 85 deg Left 80 deg     LUMBAR SPECIAL TESTS:      FUNCTIONAL TESTS:  Slight trendelenburg on the right side   GAIT:  Comments: wfl                POSTURE: rounded shoulders, increased thoracic kyphosis, and  mild scoliosis                PELVIC ALIGNMENT:   LUMBARAROM/PROM   A/PROM A/PROM  eval 05/18/2022  Flexion 75%   Extension     Right lateral flexion     Left lateral flexion     Right rotation     Left rotation      (Blank rows = not tested)   LOWER EXTREMITY ROM:                         [facility]     LOWER EXTREMITY MMT:   MMT Right eval Left eval Right  05/18/2022 Left  05/18/2022  Hip flexion     5/5 5/5  Hip extension     5/5 5/5  Hip abduction     5/5 5/5  Hip adduction     5/5 5/5  Hip internal rotation     5/5 5/5  Hip external rotation 4+/5   5/5 5/5  PALPATION:   General  Updated: 05/18/2022 Adductor insertion tenderness                  External Perineal Exam - elevated perineal body; Lt more tender than right cavernosis muscles, transverse peroneus, levators                             Internal Pelvic Floor levators tight and very tender Lt more than Rt unable to palpate beyond puborectalis due to severely tender   Patient confirms identification and approves PT to assess internal pelvic floor and treatment Yes   PELVIC MMT:   MMT eval 05/18/2022  Vaginal 2/5 with hard time relaxing and bulging 3/5 6 quick flicks, 2 second holds   Internal Anal Sphincter     External Anal Sphincter     Puborectalis     Diastasis Recti     (Blank rows = not tested)         TONE: High Updated 05/18/2022 : normal tone    PROLAPSE: none  Assessment 04/07/22: pelvic assessment and has tight and inflamed levator and ileococcygeus left; tight and tender on the Rt; left abdominal fascia restricted   TODAY'S TREATMENT   Treatment:07/13/2022 Exercises: Supine 8lb overhead in 90-90 LE position - 5 x 10 sec Alt UE to opposite LE - dead bugs - 20x   Standing   UE cable 7 lb lunge with row - 15 bil Single leg dead lift with 5lb 20 x bil Lunge with 10lb lateral 15x bil Pelvis rotation reaches  Stretches Piriformis figure 4  bil -  30 sec Happy baby - 30sec Butterfly - 30 sec Open book 10x each side Child pose rotation - 10 deep breaths on each side Quadruped rocking hip 3 ways - 10x  Sidelying Plank on knees - hip abduction - 10x bil     PATIENT EDUCATION:  Education details: Access Code: [email or web address] and wand info Person educated: Patient Education method: Explanation, Demonstration, Tactile cues, Verbal cues, and Handouts Education comprehension: verbalized understanding and returned demonstration     HOME EXERCISE PROGRAM: Access Code: [email or web address] URL: [email or web address] Date: 01/19/2022 Prepared by: Jari Favre  Exercises - Happy Baby with Pelvic Floor Lengthening  - 1 x daily - 7 x weekly - 1 sets - 3 reps - 30 hold - Supine Butterfly Groin Stretch  - 1 x daily - 7 x weekly - 1 sets - 3 reps - 30 sec hold - Supine Hip Internal and External Rotation  - 1 x daily - 7 x weekly - 1 sets - 10 reps - 5 sec hold - World's Greatest Stretch - Lunge with Ipsilateral Thoracic Rotation  - 1 x daily - 7 x weekly - 3 sets - 10 reps - Rock  - 1 x daily - 7 x weekly - 3 sets - 10 reps  Patient Education - Trigger Point Dry Needling   ASSESSMENT:   CLINICAL IMPRESSION: Pt did well with exercises and goals and HEP were updated.  Pt is expected to maintain strength and improvements made during PT and will d/c today with HEP.   OBJECTIVE IMPAIRMENTS decreased coordination, decreased endurance, decreased strength, increased fascial restrictions, increased muscle spasms, postural dysfunction, and pain.    ACTIVITY LIMITATIONS continence   PARTICIPATION LIMITATIONS: interpersonal relationship, community activity, and occupation   PERSONAL FACTORS 1-2 comorbidities: endometriosis and several surgeries this year  are also affecting patient's functional outcome.    REHAB  POTENTIAL: Excellent   CLINICAL DECISION MAKING: Evolving/moderate complexity   EVALUATION COMPLEXITY: Low      GOALS: Goals reviewed with patient? Yes   SHORT TERM GOALS: Target date: 02/11/2022   Ind with using wand for pain managment Baseline: using externally Goal status: met 02/09/22    2.  Ind with initial HEP for pain management Baseline:  Goal status: met 01/19/22      LONG TERM GOALS: Target date: 08/10/2022 Updated: 06/04/2022    Pt will be independent with advanced HEP to maintain improvements made throughout therapy   Baseline:  Goal status: MET     2.  Pt will report 75% reduction of pain due to improvements in posture, strength, and muscle length   Baseline: no pain Goal status: MET   3. Pt will be able to do kettle bell workout without increased pain Baseline: just regular muscle soreness Goal status: MET  4. Pt to demonstrate at least 4/5 pelvic floor strength for improved pelvic stability as she progress through her pregnancy  Baseline: 3/5 Goal status: NOT re-tested    5.  Pt will be able to hold  a pelvic floor contraction for 5 seconds to show an increase in pelvic floor endurance as will be beneficial as she progress through her pregnancy  Baseline:  Goal status: In progress    6.  Pt will be able to tolerate 1 hour of exercise throughout her pregnancy with pain management  Baseline: Doing normal exercises Goal status: MET       PLAN: PT FREQUENCY: 1x/week or biweekly   PT DURATION: 12 weeks   PLANNED INTERVENTIONS: Therapeutic exercises, Therapeutic activity, Neuromuscular re-education, Balance training, Gait training, Patient/Family education, Joint mobilization, Dry Needling, Spinal mobilization, Cryotherapy, Moist heat, Taping, Biofeedback, and Manual therapy   PLAN FOR NEXT SESSION: d/c today  Jule Ser, PT 07/13/2022, 2:51 PM  PHYSICAL THERAPY DISCHARGE SUMMARY  Visits from Start of Care: 14  Current functional level related to goals / functional outcomes: See above goals goals met   Remaining deficits: See above    Education / Equipment: HEP   Patient agrees to discharge. Patient goals were met. Patient is being discharged due to being pleased with the current functional level.  Gustavus Bryant, PT 07/13/22 3:22 PM

## 2022-10-05 LAB — OB RESULTS CONSOLE RPR: RPR: NONREACTIVE

## 2023-01-11 NOTE — H&P (Signed)
HPI: 27 y.o. No obstetric history on file. @ Unknown estimated gestational age (as dated by LMP c/w *** week ultrasound) presents for ***.  Leakage of fluid:  *** Vaginal bleeding:  *** Contractions:  *** Fetal movement:  ***  Prenatal care has been provided by ***  ROS:  Denies fevers, chills, chest pain, visual changes, SOB, RUQ/epigastric pain, N/V, dysuria, hematuria, or sudden onset/worsening bilateral LE or facial edema.  Pregnancy complicated by: ***   Prenatal Transfer Tool  Maternal Diabetes: {Maternal Diabetes:3043596} Genetic Screening: {Genetic Screening:20205} Maternal Ultrasounds/Referrals: {Maternal Ultrasounds / Referrals:20211} Fetal Ultrasounds or other Referrals:  {Fetal Ultrasounds or Other Referrals:20213} Maternal Substance Abuse:  {Maternal Substance Abuse:20223} Significant Maternal Medications:  {Significant Maternal Meds:20233} Significant Maternal Lab Results: {Significant Maternal Lab Results:20235}   Prenatal Labs Blood type:  *** Antibody screen:  Negative CBC:  H/H *** Rubella: Immune RPR:  Non-reactive Hep B:  Negative Hep C:  Negative HIV:  Negative GC/CT:  Negative Glucola:  ***  Immunizations: Tdap: *** Flu: ***  Contraceptive plan: ***  OBHx:  OB History   No obstetric history on file.    PMHx:  *** Meds:  PNV, *** Allergy:   Allergies  Allergen Reactions   Cephalosporins    Sulfa Antibiotics    SurgHx: *** SocHx:   *** Tobacco, ETOH, illicit drugs  O: There were no vitals taken for this visit. Gen. AAOx3, NAD CV.  RRR  Resp. CTAB, no wheezes/rales/rhonchi Abd. Gravid, soft, non-tender throughout, no rebound/guarding Extr.  *** bilateral LE edema, no calf tenderness bilaterally SVE: ***/***/***  SSE: No vulvar/vaginal/cervical lesions.  No blood visualized in vaginal vault. Cervix appeared closed. ***Pooling, *** Ferning, *** Nitrazine.  Last [place]:     FHT: *** baseline, *** variability, *** accels,  ***  decels Toco: q *** min   Labs: see orders  A/P:  27 y.o. No obstetric history on file. @ Unknown who is admitted for ***.  - Admit to *** - Admit labs (CBC, T&S, RPR) - CEFM/Toco - FWB:  *** - Diet:  *** - IVF:  *** - VTE Prophylaxis:  SCDs - GBS Status:  *** - Presentation:  *** - Pain control:  *** - Induction method:  *** - Anticipate SVD  Steva Ready, DO

## 2023-01-12 ENCOUNTER — Telehealth (HOSPITAL_COMMUNITY): Payer: Self-pay | Admitting: *Deleted

## 2023-01-13 NOTE — Telephone Encounter (Signed)
Preadmission screen  

## 2023-01-14 ENCOUNTER — Encounter (HOSPITAL_COMMUNITY): Payer: Self-pay | Admitting: *Deleted

## 2023-01-14 ENCOUNTER — Telehealth (HOSPITAL_COMMUNITY): Payer: Self-pay | Admitting: *Deleted

## 2023-01-14 NOTE — Telephone Encounter (Signed)
Preadmission screen  

## 2023-01-17 ENCOUNTER — Encounter (HOSPITAL_COMMUNITY): Payer: Self-pay | Admitting: Obstetrics and Gynecology

## 2023-01-17 ENCOUNTER — Inpatient Hospital Stay (HOSPITAL_COMMUNITY)
Admission: AD | Admit: 2023-01-17 | Discharge: 2023-01-20 | DRG: 788 | Disposition: A | Discharge status: Home / Self Care | Payer: Managed Care, Other (non HMO) | Attending: Obstetrics and Gynecology | Admitting: Obstetrics and Gynecology

## 2023-01-17 ENCOUNTER — Inpatient Hospital Stay (HOSPITAL_COMMUNITY): Payer: Managed Care, Other (non HMO)

## 2023-01-17 DIAGNOSIS — O480 Post-term pregnancy: Secondary | ICD-10-CM | POA: Diagnosis present

## 2023-01-17 DIAGNOSIS — Z98891 History of uterine scar from previous surgery: Principal | ICD-10-CM

## 2023-01-17 DIAGNOSIS — Z3490 Encounter for supervision of normal pregnancy, unspecified, unspecified trimester: Principal | ICD-10-CM | POA: Diagnosis present

## 2023-01-17 DIAGNOSIS — O368390 Maternal care for abnormalities of the fetal heart rate or rhythm, unspecified trimester, not applicable or unspecified: Secondary | ICD-10-CM | POA: Diagnosis present

## 2023-01-17 DIAGNOSIS — Z3A41 41 weeks gestation of pregnancy: Secondary | ICD-10-CM | POA: Diagnosis not present

## 2023-01-17 LAB — TYPE AND SCREEN
ABO/RH(D): A POS
Antibody Screen: NEGATIVE

## 2023-01-17 LAB — CBC
HCT: 37.8 % (ref 36.0–46.0)
Hemoglobin: 12.5 g/dL (ref 12.0–15.0)
MCH: 30.5 pg (ref 26.0–34.0)
MCHC: 33.1 g/dL (ref 30.0–36.0)
MCV: 92.2 fL (ref 80.0–100.0)
Platelets: 178 10*3/uL (ref 150–400)
RBC: 4.1 MIL/uL (ref 3.87–5.11)
RDW: 12.7 % (ref 11.5–15.5)
WBC: 8.9 10*3/uL (ref 4.0–10.5)
nRBC: 0 % (ref 0.0–0.2)

## 2023-01-17 LAB — RPR: RPR Ser Ql: NONREACTIVE

## 2023-01-17 MED ORDER — OXYCODONE-ACETAMINOPHEN 5-325 MG PO TABS: 1.0000 | ORAL_TABLET | ORAL | Status: DC | PRN

## 2023-01-17 MED ORDER — ONDANSETRON HCL 4 MG/2ML IJ SOLN: 4.0000 mg | Freq: Four times a day (QID) | INTRAMUSCULAR | Status: DC | PRN

## 2023-01-17 MED ORDER — SOD CITRATE-CITRIC ACID 500-334 MG/5ML PO SOLN
30.0000 mL | ORAL | Status: DC | PRN
  Filled 2023-01-17: qty 30

## 2023-01-17 MED ORDER — OXYCODONE-ACETAMINOPHEN 5-325 MG PO TABS: 2.0000 | ORAL_TABLET | ORAL | Status: DC | PRN

## 2023-01-17 MED ORDER — LIDOCAINE HCL (PF) 1 % IJ SOLN: 30.0000 mL | INTRAMUSCULAR | Status: DC | PRN

## 2023-01-17 MED ORDER — TERBUTALINE SULFATE 1 MG/ML IJ SOLN: 0.2500 mg | Freq: Once | INTRAMUSCULAR | Status: DC | PRN

## 2023-01-17 MED ORDER — OXYTOCIN-SODIUM CHLORIDE 30-0.9 UT/500ML-% IV SOLN
1.0000 m[IU]/min | INTRAVENOUS | Status: DC
  Administered 2023-01-17: 2 m[IU]/min via INTRAVENOUS
  Filled 2023-01-17: qty 500

## 2023-01-17 MED ORDER — ACETAMINOPHEN 325 MG PO TABS: 650.0000 mg | ORAL_TABLET | ORAL | Status: DC | PRN

## 2023-01-17 MED ORDER — LACTATED RINGERS IV SOLN: 500.0000 mL | INTRAVENOUS | Status: DC | PRN

## 2023-01-17 MED ORDER — MISOPROSTOL 25 MCG QUARTER TABLET
25.0000 ug | ORAL_TABLET | ORAL | Status: DC | PRN
  Administered 2023-01-17: 25 ug via VAGINAL
  Filled 2023-01-17: qty 1

## 2023-01-17 MED ORDER — LACTATED RINGERS IV SOLN: INTRAVENOUS | Status: DC

## 2023-01-17 MED ORDER — FENTANYL CITRATE (PF) 100 MCG/2ML IJ SOLN: 50.0000 ug | INTRAMUSCULAR | Status: DC | PRN

## 2023-01-17 MED ORDER — TRANEXAMIC ACID-NACL 1000-0.7 MG/100ML-% IV SOLN
1000.0000 mg | Freq: Once | INTRAVENOUS | Status: AC
  Administered 2023-01-18: 1000 mg via INTRAVENOUS
  Filled 2023-01-17: qty 100

## 2023-01-17 MED ORDER — OXYTOCIN-SODIUM CHLORIDE 30-0.9 UT/500ML-% IV SOLN: 2.5000 [IU]/h | INTRAVENOUS | Status: DC

## 2023-01-17 MED ORDER — HYDROXYZINE HCL 50 MG PO TABS: 50.0000 mg | ORAL_TABLET | Freq: Four times a day (QID) | ORAL | Status: DC | PRN

## 2023-01-17 MED ORDER — OXYTOCIN BOLUS FROM INFUSION: 333.0000 mL | Freq: Once | INTRAVENOUS | Status: DC

## 2023-01-17 NOTE — Progress Notes (Signed)
   Labor Progress Note  Mary Reilly, 27 y.o., G1P0, with an IUP @ [redacted]w[redacted]d, presented for IOL for postdates at 41.1 weeks.  Prenatal care has been provided by Dr. Katrinka Blazing. Connye Burkitt [facility]). Pregnancy complicated by: IVF pregnancy, Mild fetal renal pyelectasis (resolved), Severe endometriosis (renal/bowel involvement, previously complicated by left hydronephrosis - s/p surgical management of such with, ureteroneocystotomy in 2023).  Subjective: I assumed care of pt at 1900. Pt was sitting in chair breathing through cxt, pt endorses feeling cxt more intense now, discussed plan, and pain management, pt going with the flow. Progress in latent labor. Tolerating well.  Patient Active Problem List   Diagnosis Date Noted   Encounter for induction of labor 01/17/2023   Objective: BP 122/82   Pulse (!) 59   Temp 98.2 F (36.8 C) (Oral)   Ht 5' (1.524 m)   Wt 83.9 kg   BMI 36.13 kg/m  No intake/output data recorded. No intake/output data recorded. NST: FHR baseline 130 bpm, Variability: moderate, Accelerations:present, Decelerations:  Absent= Cat 1/Reactive CTX:  regular, every 3-4 minutes Uterus gravid, soft non tender, moderate to palpate with contractions.  SVE:  Dilation: 5 Effacement (%): 50 Station: -2 Exam by:: Lesly Rubenstein, CNM Pitocin at N/A mUn/min  Assessment:  Mary Reilly, 27 y.o., G1P0, with an IUP @ [redacted]w[redacted]d, presented for IOL for postdates at 41.1 weeks.  Prenatal care has been provided by Dr. Katrinka Blazing. Connye Burkitt [facility] OBG[facility]). Pregnancy complicated by: IVF pregnancy, Mild fetal renal pyelectasis (resolved), Severe endometriosis (renal/bowel involvement, previously complicated by left hydronephrosis - s/p surgical management of such with, ureteroneocystotomy in 2023). Progressing in latent labor.  Patient Active Problem List   Diagnosis Date Noted   Encounter for induction of labor 01/17/2023   NICHD: Category 1  Membranes:  6/17 @ 1829 AROM, blood tinged x no  s/s of infection  Induction:    Cytotec x 1004 VA on 6/17  Foley Bulb: N/A  Pitocin - N/A  Pain management:               IV pain management: x PRN  Nitrous: PRN             Epidural placement: PRN  GBS Negative   Plan: Continue labor plan Continuous monitoring Rest Ambulate Frequent position changes to facilitate fetal rotation and descent. Will reassess with cervical exam at 4 hours or earlier if necessary Expectant management.  Anticipate labor progression and vaginal delivery.  Plan for TXA abd Pitocin PP Prophylactic r/t IVF and increased risk on PPH r/t retained placenta.   [facility] CNM, FNP-C, PMHNP-BC  [address]  [place], [place] [address]  Cell: [phone]  Office Phone: [phone] Fax: [phone] 01/17/2023  9:47 PM

## 2023-01-17 NOTE — Progress Notes (Signed)
  Subjective: Pt had deep variables due to tachysystole on 4 mU of pitocin. Variables resolved with discontinuation of pitocin and 1 liter fluid bolus. FHR currently category 1. Pt reports mild contractions off the pitocin   Objective: BP 132/81   Pulse 65   Temp 98.3 F (36.8 C)   Ht 5' (1.524 m)   Wt 83.9 kg   BMI 36.13 kg/m  No intake/output data recorded. No intake/output data recorded.  FHT:  FHR: 135 bpm, variability: moderate,  accelerations:  Present,  decelerations:  Absent UC:   Irregular SVE:   Dilation: 4 Effacement (%): 50 Station: -2 Exam by:: Hisao Doo AROM clear fluid   Labs: Lab Results  Component Value Date   WBC 8.9 01/17/2023   HGB 12.5 01/17/2023   HCT 37.8 01/17/2023   MCV 92.2 01/17/2023   PLT 178 01/17/2023    Assessment / Plan: Induction of labor due to post dates   Labor:  AROM performed will hold pitocin for now.. recheck cervix in 2 hours if no change restart pitocin.  Preeclampsia:   NA Fetal Wellbeing:  Category I Pain Control:  Labor support without medications I/D:  n/a CNM [facility] and Dr. Su Hilt covering after 7 pm.   Gerald Leitz, MD 01/17/2023, 6:42 PM

## 2023-01-17 NOTE — Progress Notes (Signed)
Mary Reilly is a 27 y.o. G1P0 at [redacted]w[redacted]d by LMP admitted for induction of labor due to Post dates. Due date January 08, 2023.  Subjective: Reports mild contractions. No lof no vaginal bleeding. + FM   Objective: BP 125/89   Pulse 83   Temp 98.3 F (36.8 C)   Ht 5' (1.524 m)   Wt 83.9 kg   BMI 36.13 kg/m  No intake/output data recorded. No intake/output data recorded.  FHT:  FHR: 130 bpm, variability: moderate,  accelerations:  Present,  decelerations:  Absent UC:   not graphing well  SVE:   Dilation: 1 Effacement (%): Thick Station: -2 Exam by:: m wilkins rnc  Labs: Lab Results  Component Value Date   WBC 8.9 01/17/2023   HGB 12.5 01/17/2023   HCT 37.8 01/17/2023   MCV 92.2 01/17/2023   PLT 178 01/17/2023    Assessment / Plan: Induction of labor due to postdates   Labor:  cervical ripening with cytotec next cervical check at 205 pm  Preeclampsia:   NA Fetal Wellbeing:  Category I Pain Control:  Labor support without medications I/D:  n/a Anticipated MOD:  NSVD  Gerald Leitz, MD 01/17/2023, 1:38 PM

## 2023-01-18 ENCOUNTER — Encounter (HOSPITAL_COMMUNITY): Payer: Self-pay | Admitting: Obstetrics and Gynecology

## 2023-01-18 ENCOUNTER — Encounter (HOSPITAL_COMMUNITY)
Admission: AD | Disposition: A | Discharge status: Home / Self Care | Payer: Self-pay | Source: Home / Self Care | Attending: Obstetrics and Gynecology

## 2023-01-18 ENCOUNTER — Inpatient Hospital Stay (HOSPITAL_COMMUNITY): Payer: Managed Care, Other (non HMO) | Admitting: Anesthesiology

## 2023-01-18 ENCOUNTER — Other Ambulatory Visit: Payer: Self-pay

## 2023-01-18 DIAGNOSIS — O368390 Maternal care for abnormalities of the fetal heart rate or rhythm, unspecified trimester, not applicable or unspecified: Secondary | ICD-10-CM | POA: Diagnosis present

## 2023-01-18 DIAGNOSIS — Z98891 History of uterine scar from previous surgery: Secondary | ICD-10-CM

## 2023-01-18 DIAGNOSIS — Z3A41 41 weeks gestation of pregnancy: Secondary | ICD-10-CM

## 2023-01-18 DIAGNOSIS — Z3490 Encounter for supervision of normal pregnancy, unspecified, unspecified trimester: Secondary | ICD-10-CM

## 2023-01-18 SURGERY — Surgical Case
Anesthesia: Epidural

## 2023-01-18 MED ORDER — LACTATED RINGERS IV SOLN
500.0000 mL | Freq: Once | INTRAVENOUS | Status: AC
  Administered 2023-01-18: 500 mL via INTRAVENOUS

## 2023-01-18 MED ORDER — PHENYLEPHRINE 80 MCG/ML (10ML) SYRINGE FOR IV PUSH (FOR BLOOD PRESSURE SUPPORT)
PREFILLED_SYRINGE | INTRAVENOUS | Status: AC
  Filled 2023-01-18: qty 10

## 2023-01-18 MED ORDER — OXYTOCIN-SODIUM CHLORIDE 30-0.9 UT/500ML-% IV SOLN
INTRAVENOUS | Status: AC
  Filled 2023-01-18: qty 500

## 2023-01-18 MED ORDER — ZOLPIDEM TARTRATE 5 MG PO TABS: 5.0000 mg | ORAL_TABLET | Freq: Every evening | ORAL | Status: DC | PRN

## 2023-01-18 MED ORDER — LACTATED RINGERS IV SOLN: INTRAVENOUS | Status: DC

## 2023-01-18 MED ORDER — FENTANYL CITRATE (PF) 100 MCG/2ML IJ SOLN
INTRAMUSCULAR | Status: AC
  Filled 2023-01-18: qty 2

## 2023-01-18 MED ORDER — METOCLOPRAMIDE HCL 5 MG/ML IJ SOLN
INTRAMUSCULAR | Status: DC | PRN
  Administered 2023-01-18: 10 mg via INTRAVENOUS

## 2023-01-18 MED ORDER — CLINDAMYCIN PHOSPHATE 900 MG/50ML IV SOLN
900.0000 mg | INTRAVENOUS | Status: AC
  Administered 2023-01-18: 900 mg via INTRAVENOUS

## 2023-01-18 MED ORDER — OXYCODONE HCL 5 MG PO TABS: 5.0000 mg | ORAL_TABLET | Freq: Once | ORAL | Status: DC | PRN

## 2023-01-18 MED ORDER — ONDANSETRON HCL 4 MG/2ML IJ SOLN
INTRAMUSCULAR | Status: DC | PRN
  Administered 2023-01-18: 4 mg via INTRAVENOUS

## 2023-01-18 MED ORDER — OXYTOCIN-SODIUM CHLORIDE 30-0.9 UT/500ML-% IV SOLN
1.0000 m[IU]/min | INTRAVENOUS | Status: DC
  Administered 2023-01-18: 1 m[IU]/min via INTRAVENOUS

## 2023-01-18 MED ORDER — ACETAMINOPHEN 500 MG PO TABS
1000.0000 mg | ORAL_TABLET | Freq: Four times a day (QID) | ORAL | Status: DC
  Administered 2023-01-18 – 2023-01-20 (×8): 1000 mg via ORAL
  Filled 2023-01-18 (×8): qty 2

## 2023-01-18 MED ORDER — DIPHENHYDRAMINE HCL 50 MG/ML IJ SOLN: 12.5000 mg | INTRAMUSCULAR | Status: DC | PRN

## 2023-01-18 MED ORDER — PHENYLEPHRINE 80 MCG/ML (10ML) SYRINGE FOR IV PUSH (FOR BLOOD PRESSURE SUPPORT): 80.0000 ug | PREFILLED_SYRINGE | INTRAVENOUS | Status: DC | PRN

## 2023-01-18 MED ORDER — LIDOCAINE-EPINEPHRINE (PF) 2 %-1:200000 IJ SOLN
INTRAMUSCULAR | Status: DC | PRN
  Administered 2023-01-18 (×4): 5 mL via EPIDURAL
  Administered 2023-01-18: 4 mL via EPIDURAL

## 2023-01-18 MED ORDER — LIDOCAINE HCL (PF) 1 % IJ SOLN
INTRAMUSCULAR | Status: DC | PRN
  Administered 2023-01-18: 5 mL via EPIDURAL
  Administered 2023-01-18: 3 mL via EPIDURAL

## 2023-01-18 MED ORDER — METHYLERGONOVINE MALEATE 0.2 MG/ML IJ SOLN
INTRAMUSCULAR | Status: DC | PRN
  Administered 2023-01-18: .2 mg via INTRAMUSCULAR

## 2023-01-18 MED ORDER — SODIUM CHLORIDE 0.9 % IV SOLN
500.0000 mg | INTRAVENOUS | Status: AC
  Administered 2023-01-18: 500 mg via INTRAVENOUS

## 2023-01-18 MED ORDER — FENTANYL CITRATE (PF) 100 MCG/2ML IJ SOLN
INTRAMUSCULAR | Status: DC | PRN
  Administered 2023-01-18: 100 ug via INTRAVENOUS

## 2023-01-18 MED ORDER — BUPIVACAINE LIPOSOME 1.3 % IJ SUSP: INTRAMUSCULAR | Status: DC | PRN

## 2023-01-18 MED ORDER — LACTATED RINGERS AMNIOINFUSION: INTRAVENOUS | Status: DC

## 2023-01-18 MED ORDER — BUPIVACAINE LIPOSOME 1.3 % IJ SUSP
INTRAMUSCULAR | Status: AC
  Filled 2023-01-18: qty 20

## 2023-01-18 MED ORDER — DIBUCAINE (PERIANAL) 1 % EX OINT: 1.0000 | TOPICAL_OINTMENT | CUTANEOUS | Status: DC | PRN

## 2023-01-18 MED ORDER — SIMETHICONE 80 MG PO CHEW
80.0000 mg | CHEWABLE_TABLET | Freq: Three times a day (TID) | ORAL | Status: DC
  Administered 2023-01-18 – 2023-01-20 (×5): 80 mg via ORAL
  Filled 2023-01-18 (×5): qty 1

## 2023-01-18 MED ORDER — OXYTOCIN-SODIUM CHLORIDE 30-0.9 UT/500ML-% IV SOLN
1.0000 m[IU]/min | INTRAVENOUS | Status: DC
  Administered 2023-01-18: 4 m[IU]/min via INTRAVENOUS

## 2023-01-18 MED ORDER — BUPIVACAINE HCL (PF) 0.25 % IJ SOLN
INTRAMUSCULAR | Status: DC | PRN
  Administered 2023-01-18: 30 mL

## 2023-01-18 MED ORDER — SIMETHICONE 80 MG PO CHEW: 80.0000 mg | CHEWABLE_TABLET | ORAL | Status: DC | PRN

## 2023-01-18 MED ORDER — ONDANSETRON HCL 4 MG/2ML IJ SOLN
INTRAMUSCULAR | Status: AC
  Filled 2023-01-18: qty 2

## 2023-01-18 MED ORDER — COCONUT OIL OIL: 1.0000 | TOPICAL_OIL | Status: DC | PRN

## 2023-01-18 MED ORDER — EPHEDRINE 5 MG/ML INJ: 10.0000 mg | INTRAVENOUS | Status: DC | PRN

## 2023-01-18 MED ORDER — MORPHINE SULFATE (PF) 0.5 MG/ML IJ SOLN
INTRAMUSCULAR | Status: DC | PRN
  Administered 2023-01-18: 3 mg via EPIDURAL

## 2023-01-18 MED ORDER — BUPIVACAINE HCL (PF) 0.25 % IJ SOLN
INTRAMUSCULAR | Status: AC
  Filled 2023-01-18: qty 30

## 2023-01-18 MED ORDER — MENTHOL 3 MG MT LOZG: 1.0000 | LOZENGE | OROMUCOSAL | Status: DC | PRN

## 2023-01-18 MED ORDER — GENTAMICIN SULFATE 40 MG/ML IJ SOLN
5.0000 mg/kg | INTRAVENOUS | Status: AC
  Administered 2023-01-18: 300 mg via INTRAVENOUS
  Filled 2023-01-18: qty 7.5

## 2023-01-18 MED ORDER — MORPHINE SULFATE (PF) 0.5 MG/ML IJ SOLN
INTRAMUSCULAR | Status: AC
  Filled 2023-01-18: qty 10

## 2023-01-18 MED ORDER — OXYCODONE HCL 5 MG PO TABS: 5.0000 mg | ORAL_TABLET | ORAL | Status: DC | PRN

## 2023-01-18 MED ORDER — KETOROLAC TROMETHAMINE 30 MG/ML IJ SOLN
30.0000 mg | Freq: Four times a day (QID) | INTRAMUSCULAR | Status: AC
  Administered 2023-01-18 – 2023-01-19 (×2): 30 mg via INTRAVENOUS
  Filled 2023-01-18 (×2): qty 1

## 2023-01-18 MED ORDER — DEXMEDETOMIDINE HCL IN NACL 80 MCG/20ML IV SOLN
INTRAVENOUS | Status: DC | PRN
  Administered 2023-01-18: 8 ug via INTRAVENOUS

## 2023-01-18 MED ORDER — FENTANYL CITRATE (PF) 100 MCG/2ML IJ SOLN: 50.0000 ug | INTRAMUSCULAR | Status: DC | PRN

## 2023-01-18 MED ORDER — OXYTOCIN-SODIUM CHLORIDE 30-0.9 UT/500ML-% IV SOLN
2.5000 [IU]/h | INTRAVENOUS | Status: AC
  Administered 2023-01-19: 2.5 [IU]/h via INTRAVENOUS
  Filled 2023-01-18: qty 500

## 2023-01-18 MED ORDER — OXYTOCIN-SODIUM CHLORIDE 30-0.9 UT/500ML-% IV SOLN
INTRAVENOUS | Status: DC | PRN
  Administered 2023-01-18: 41.7 mL/h via INTRAVENOUS

## 2023-01-18 MED ORDER — KETOROLAC TROMETHAMINE 30 MG/ML IJ SOLN
30.0000 mg | Freq: Once | INTRAMUSCULAR | Status: AC | PRN
  Administered 2023-01-18: 30 mg via INTRAVENOUS

## 2023-01-18 MED ORDER — SODIUM CHLORIDE 0.9 % IV SOLN
INTRAVENOUS | Status: AC
  Filled 2023-01-18: qty 5

## 2023-01-18 MED ORDER — SENNOSIDES-DOCUSATE SODIUM 8.6-50 MG PO TABS
2.0000 | ORAL_TABLET | Freq: Every day | ORAL | Status: DC
  Administered 2023-01-19 – 2023-01-20 (×2): 2 via ORAL
  Filled 2023-01-18 (×2): qty 2

## 2023-01-18 MED ORDER — METOCLOPRAMIDE HCL 5 MG/ML IJ SOLN
INTRAMUSCULAR | Status: AC
  Filled 2023-01-18: qty 2

## 2023-01-18 MED ORDER — ACETAMINOPHEN 10 MG/ML IV SOLN
INTRAVENOUS | Status: AC
  Filled 2023-01-18: qty 200

## 2023-01-18 MED ORDER — LACTATED RINGERS IV SOLN: INTRAVENOUS | Status: DC | PRN

## 2023-01-18 MED ORDER — PRENATAL MULTIVITAMIN CH
1.0000 | ORAL_TABLET | Freq: Every day | ORAL | Status: DC
  Administered 2023-01-20: 1 via ORAL
  Filled 2023-01-18: qty 1

## 2023-01-18 MED ORDER — DIPHENHYDRAMINE HCL 25 MG PO CAPS: 25.0000 mg | ORAL_CAPSULE | Freq: Four times a day (QID) | ORAL | Status: DC | PRN

## 2023-01-18 MED ORDER — FENTANYL CITRATE (PF) 100 MCG/2ML IJ SOLN: 25.0000 ug | INTRAMUSCULAR | Status: DC | PRN

## 2023-01-18 MED ORDER — PROMETHAZINE HCL 25 MG/ML IJ SOLN: 6.2500 mg | INTRAMUSCULAR | Status: DC | PRN

## 2023-01-18 MED ORDER — WITCH HAZEL-GLYCERIN EX PADS: 1.0000 | MEDICATED_PAD | CUTANEOUS | Status: DC | PRN

## 2023-01-18 MED ORDER — IBUPROFEN 600 MG PO TABS
600.0000 mg | ORAL_TABLET | Freq: Four times a day (QID) | ORAL | Status: DC
  Administered 2023-01-19 – 2023-01-20 (×6): 600 mg via ORAL
  Filled 2023-01-18 (×6): qty 1

## 2023-01-18 MED ORDER — TRANEXAMIC ACID-NACL 1000-0.7 MG/100ML-% IV SOLN: 1000.0000 mg | Freq: Once | INTRAVENOUS | Status: DC

## 2023-01-18 MED ORDER — KETOROLAC TROMETHAMINE 30 MG/ML IJ SOLN
INTRAMUSCULAR | Status: AC
  Filled 2023-01-18: qty 1

## 2023-01-18 MED ORDER — OXYCODONE HCL 5 MG/5ML PO SOLN: 5.0000 mg | Freq: Once | ORAL | Status: DC | PRN

## 2023-01-18 MED ORDER — ACETAMINOPHEN 10 MG/ML IV SOLN
INTRAVENOUS | Status: AC
  Filled 2023-01-18: qty 100

## 2023-01-18 MED ORDER — KETOROLAC TROMETHAMINE 30 MG/ML IJ SOLN: 30.0000 mg | Freq: Once | INTRAMUSCULAR | Status: DC | PRN

## 2023-01-18 MED ORDER — DEXAMETHASONE SODIUM PHOSPHATE 4 MG/ML IJ SOLN
INTRAMUSCULAR | Status: AC
  Filled 2023-01-18: qty 1

## 2023-01-18 MED ORDER — ACETAMINOPHEN 10 MG/ML IV SOLN
INTRAVENOUS | Status: DC | PRN
  Administered 2023-01-18: 1000 mg via INTRAVENOUS

## 2023-01-18 MED ORDER — ONDANSETRON HCL 4 MG/2ML IJ SOLN
4.0000 mg | Freq: Four times a day (QID) | INTRAMUSCULAR | Status: DC | PRN
  Administered 2023-01-18: 4 mg via INTRAVENOUS

## 2023-01-18 MED ORDER — LIDOCAINE-EPINEPHRINE (PF) 2 %-1:200000 IJ SOLN
INTRAMUSCULAR | Status: AC
  Filled 2023-01-18: qty 20

## 2023-01-18 MED ORDER — FENTANYL-BUPIVACAINE-NACL 0.5-0.125-0.9 MG/250ML-% EP SOLN
12.0000 mL/h | EPIDURAL | Status: DC | PRN
  Administered 2023-01-18: 12 mL/h via EPIDURAL
  Filled 2023-01-18: qty 250

## 2023-01-18 MED ORDER — CLINDAMYCIN PHOSPHATE 900 MG/50ML IV SOLN
INTRAVENOUS | Status: AC
  Filled 2023-01-18: qty 50

## 2023-01-18 MED ORDER — SOD CITRATE-CITRIC ACID 500-334 MG/5ML PO SOLN
30.0000 mL | ORAL | Status: AC
  Administered 2023-01-18: 30 mL via ORAL

## 2023-01-18 SURGICAL SUPPLY — 39 items
APL PRP STRL LF DISP 70% ISPRP (MISCELLANEOUS) ×2
APL SKNCLS STERI-STRIP NONHPOA (GAUZE/BANDAGES/DRESSINGS) ×1
BARRIER ADHS 3X4 INTERCEED (GAUZE/BANDAGES/DRESSINGS) IMPLANT
BENZOIN TINCTURE PRP APPL 2/3 (GAUZE/BANDAGES/DRESSINGS) ×1 IMPLANT
BINDER ABDOMINAL 10 UNV 27-48 (MISCELLANEOUS) IMPLANT
BINDER ABDOMINAL 12 ML 46-62 (SOFTGOODS) IMPLANT
BRR ADH 4X3 ABS CNTRL BYND (GAUZE/BANDAGES/DRESSINGS) ×1
CHLORAPREP W/TINT 26 (MISCELLANEOUS) ×2 IMPLANT
CLAMP UMBILICAL CORD (MISCELLANEOUS) ×1 IMPLANT
CLOTH BEACON ORANGE TIMEOUT ST (SAFETY) ×1 IMPLANT
DRSG OPSITE POSTOP 4X10 (GAUZE/BANDAGES/DRESSINGS) ×1 IMPLANT
DRSG OPSITE POSTOP 4X8 (GAUZE/BANDAGES/DRESSINGS) IMPLANT
ELECT REM PT RETURN 9FT ADLT (ELECTROSURGICAL) ×1
ELECTRODE REM PT RTRN 9FT ADLT (ELECTROSURGICAL) ×1 IMPLANT
EXTRACTOR VACUUM KIWI (MISCELLANEOUS) IMPLANT
GAUZE SPONGE 4X4 12PLY STRL (GAUZE/BANDAGES/DRESSINGS) IMPLANT
GLOVE BIOGEL M 7.0 STRL (GLOVE) ×2 IMPLANT
GLOVE BIOGEL PI IND STRL 7.0 (GLOVE) ×3 IMPLANT
GOWN STRL REUS W/TWL LRG LVL3 (GOWN DISPOSABLE) ×3 IMPLANT
KIT ABG SYR 3ML LUER SLIP (SYRINGE) IMPLANT
NDL HYPO 25X5/8 SAFETYGLIDE (NEEDLE) IMPLANT
NEEDLE HYPO 25X5/8 SAFETYGLIDE (NEEDLE) IMPLANT
NS IRRIG 1000ML POUR BTL (IV SOLUTION) ×1 IMPLANT
PACK C SECTION WH (CUSTOM PROCEDURE TRAY) ×1 IMPLANT
PAD ABD 8X10 STRL (GAUZE/BANDAGES/DRESSINGS) IMPLANT
PAD OB MATERNITY 4.3X12.25 (PERSONAL CARE ITEMS) ×1 IMPLANT
RTRCTR C-SECT PINK 25CM LRG (MISCELLANEOUS) IMPLANT
STRIP CLOSURE SKIN 1/2X4 (GAUZE/BANDAGES/DRESSINGS) ×1 IMPLANT
SUT MNCRL 0 VIOLET CTX 36 (SUTURE) ×2 IMPLANT
SUT MON AB 4-0 PS1 27 (SUTURE) ×1 IMPLANT
SUT PDS AB 0 CT1 27 (SUTURE) ×2 IMPLANT
SUT PLAIN 0 NONE (SUTURE) IMPLANT
SUT VIC AB 2-0 CT1 27 (SUTURE) ×1
SUT VIC AB 2-0 CT1 TAPERPNT 27 (SUTURE) ×1 IMPLANT
SUT VIC AB 3-0 SH 27 (SUTURE)
SUT VIC AB 3-0 SH 27X BRD (SUTURE) IMPLANT
TOWEL OR 17X24 6PK STRL BLUE (TOWEL DISPOSABLE) ×1 IMPLANT
TRAY FOLEY W/BAG SLVR 14FR LF (SET/KITS/TRAYS/PACK) ×1 IMPLANT
WATER STERILE IRR 1000ML POUR (IV SOLUTION) ×1 IMPLANT

## 2023-01-18 NOTE — Op Note (Signed)
Cesarean Section Procedure Note  Indications: non-reassuring fetal status  Pre-operative Diagnosis: 41 week 2 day pregnancy.  Post-operative Diagnosis: same  Surgeon: Gerald Leitz M.D.  Assistants: [facility] CNM assisted due to complexity of the anatomy   Anesthesia: Epidural anesthesia  ASA Class: 2   Procedure Details   The patient was seen in the Holding Room. The risks, benefits, complications, treatment options, and expected outcomes were discussed with the patient.  The patient concurred with the proposed plan, giving informed consent.  The site of surgery properly noted/marked. The patient was taken to Operating Room # A, identified as Mary Reilly and the procedure verified as C-Section Delivery. A Time Out was held and the above information confirmed.  After induction of anesthesia, the patient was draped and prepped in the usual sterile manner. A Pfannenstiel incision was made and carried down through the subcutaneous tissue to the fascia. Fascial incision was made and extended transversely. The fascia was separated from the underlying rectus tissue superiorly and inferiorly. The peritoneum was identified and entered. Peritoneal incision was extended longitudinally. The utero-vesical peritoneal reflection was incised transversely and the bladder flap was bluntly freed from the lower uterine segment. A low transverse uterine incision was made. Delivered from cephalic presentation was a 3230 gram Female with Apgar scores of 9 at one minute and 9 at five minutes. After the umbilical cord was clamped and cut cord blood was obtained for evaluation. The placenta was removed intact and appeared normal. The uterine outline, tubes and ovaries appeared normal. The uterine incision was closed with running locked sutures of  0 monocryl  . Hemostasis was observed. Lavage was carried out until clear. The fascia was then reapproximated with running sutures of  0 pds . The skin was  reapproximated with  4-0 vicryl .  Instrument, sponge, and needle counts were correct prior the abdominal closure and at the conclusion of the case.   Findings: Adhesions of the bladder to the lower uterine segment. Female infant in the cephalic presentation.   Estimated Blood Loss:   843 mL         Drains: None         Total IV Fluids:  per anesthesia ml         Specimens: Placenta and Disposition:  Sent to Pathology          Implants: None         Complications:  None; patient tolerated the procedure well.         Disposition: PACU - hemodynamically stable.         Condition: stable  Attending Attestation: I performed the procedure.

## 2023-01-18 NOTE — Progress Notes (Signed)
   Labor Progress Note  Mary Reilly, 27 y.o., G1P0, with an IUP @ [redacted]w[redacted]d, presented for IOL for postdates at 41.1 weeks.  Prenatal care has been provided by Dr. Katrinka Blazing. Connye Burkitt [facility] OBGYN). Pregnancy complicated by: IVF pregnancy, Mild fetal renal pyelectasis (resolved), Severe endometriosis (renal/bowel involvement, previously complicated by left hydronephrosis - s/p surgical management of such with, ureteroneocystotomy in 2023).  Subjective: Pt cxt spaced and pt was able to get some rest, discussed retrial of pitocin r/b/a, pt aware and desires to try 1x1 pitocin, reviewed pain management again pt still feeling ok and good to continue natural. No cervical change over last 4 hours. Continues in latent labor.  Patient Active Problem List   Diagnosis Date Noted   Encounter for induction of labor 01/17/2023   Objective: BP 117/77   Pulse 68   Temp 98 F (36.7 C) (Oral)   Resp 14   Ht 5' (1.524 m)   Wt 83.9 kg   BMI 36.13 kg/m  No intake/output data recorded. No intake/output data recorded. NST: FHR baseline 130 bpm, Variability: moderate, Accelerations:present, Decelerations:  Occ varible= Cat 1/Reactive CTX:  regular, every 4-5 minutes, lasting 40-80 seconds Uterus gravid, soft non tender, moderate to palpate with contractions.  SVE:  Dilation: 4 Effacement (%): 70 Station: -1 Exam by:: Lesly Rubenstein, NP Pitocin at N/A mUn/min  Assessment:  Mary Reilly, 27 y.o., G1P0, with an IUP @ [redacted]w[redacted]d, presented for IOL for postdates at 41.1 weeks.  Prenatal care has been provided by Dr. Katrinka Blazing. Connye Burkitt [facility] OBGYN). Pregnancy complicated by: IVF pregnancy, Mild fetal renal pyelectasis (resolved), Severe endometriosis (renal/bowel involvement, previously complicated by left hydronephrosis - s/p surgical management of such with, ureteroneocystotomy in 2023). ProtraCTED in latent labor.  Patient Active Problem List   Diagnosis Date Noted   Encounter for induction of labor  01/17/2023   NICHD: Category 1  Membranes:  6/17 @ 1829 AROM, blood tinged x no s/s of infection  Induction:    Cytotec x 1004 VA on 6/17  Foley Bulb: N/A  Pitocin - N/A  Pain management:               IV pain management: x PRN  Nitrous: PRN             Epidural placement: PRN  GBS Negative   Plan: Continue labor plan Continuous monitoring Rest Ambulate Frequent position changes to facilitate fetal rotation and descent. Will reassess with cervical exam at 4 hours or earlier if necessary Start pitocin 1x1 Anticipate IUPC if increasing pitocin not does induce cervical change.  Anticipate labor progression and vaginal delivery.  Plan for TXA abd Pitocin PP Prophylactic r/t IVF and increased risk on PPH r/t retained placenta.   [facility] CNM, FNP-C, PMHNP-BC  [address]  [place], [place] [address]  Cell: [phone]  Office Phone: [phone] Fax: [phone] 01/18/2023  3:42 AM

## 2023-01-18 NOTE — Anesthesia Preprocedure Evaluation (Signed)
Anesthesia Evaluation  Patient identified by MRN, date of birth, ID band Patient awake    Reviewed: Allergy & Precautions, NPO status , Patient's Chart, lab work & pertinent test results  History of Anesthesia Complications Negative for: history of anesthetic complications  Airway Mallampati: III  TM Distance: >3 FB Neck ROM: Full    Dental   Pulmonary neg pulmonary ROS   Pulmonary exam normal breath sounds clear to auscultation       Cardiovascular negative cardio ROS  Rhythm:Regular Rate:Normal     Neuro/Psych negative neurological ROS     GI/Hepatic negative GI ROS, Neg liver ROS,,,  Endo/Other  negative endocrine ROS    Renal/GU      Musculoskeletal   Abdominal   Peds  Hematology negative hematology ROS (+)   Anesthesia Other Findings  27 y.o., G1P0, with an IUP @ [redacted]w[redacted]d, presented for IOL for postdates at 41.1 weeks.  Prenatal care has been provided by Dr. Katrinka Blazing. Connye Burkitt [facility] OBGYN). Pregnancy complicated by: IVF pregnancy, Mild fetal renal pyelectasis (resolved), Severe endometriosis (renal/bowel involvement, previously complicated by left hydronephrosis - s/p surgical management of such with, ureteroneocystotomy in 2023).  Reproductive/Obstetrics (+) Pregnancy endometriosis                              Anesthesia Physical Anesthesia Plan  ASA: 2  Anesthesia Plan: Epidural   Post-op Pain Management:    Induction:   PONV Risk Score and Plan:   Airway Management Planned:   Additional Equipment:   Intra-op Plan:   Post-operative Plan:   Informed Consent: I have reviewed the patients History and Physical, chart, labs and discussed the procedure including the risks, benefits and alternatives for the proposed anesthesia with the patient or authorized representative who has indicated his/her understanding and acceptance.       Plan Discussed with:  Anesthesiologist  Anesthesia Plan Comments: (I have discussed risks of neuraxial anesthesia including but not limited to infection, bleeding, nerve injury, back pain, headache, seizures, and failure of block. Patient denies bleeding disorders and is not currently anticoagulated. Labs have been reviewed. Risks and benefits discussed. All patient's questions answered.  )         Anesthesia Quick Evaluation

## 2023-01-18 NOTE — Progress Notes (Signed)
   Labor Progress Note  Mary Reilly, 27 y.o., G1P0, with an IUP @ [redacted]w[redacted]d, presented for IOL for postdates at 41.1 weeks.  Prenatal care has been provided by Dr. Katrinka Blazing. Connye Burkitt [facility]). Pregnancy complicated by: IVF pregnancy, Mild fetal renal pyelectasis (resolved), Severe endometriosis (renal/bowel involvement, previously complicated by left hydronephrosis - s/p surgical management of such with, ureteroneocystotomy in 2023).  Subjective: Pt got an epidural and comfortable, took about , rechecked showed no change, Cat 2 strip noted with decel, variables, amnio started , pit on 4 and decled did not resolved therefore pitocin then turned off and DR Richardson Dopp was in the room to assess, suspect CPD, with early decels noted at 4cm earlier and caput noted. Discussed R/B/A of PCS and pt desires to proceed with PCS for NRFHT.  Patient Active Problem List   Diagnosis Date Noted   Non-reassuring electronic fetal monitoring tracing 01/18/2023   Encounter for induction of labor 01/17/2023   Objective: BP 124/77   Pulse 61   Temp 98 F (36.7 C) (Oral)   Resp 16   Ht 5' (1.524 m)   Wt 83.9 kg   SpO2 100%   BMI 36.13 kg/m  No intake/output data recorded. No intake/output data recorded. NST: FHR baseline 130 bpm, Variability: moderate, Accelerations:present, Decelerations: varible= Cat 2/Reactive CTX:  regular, every 2-4 minutes, lasting 40-80 seconds Uterus gravid, soft non tender, moderate to palpate with contractions.  SVE:  Dilation: 5 Effacement (%): 70 Station: -2 Exam by:: Margarete Horace NP Pitocin at 4 and turned off mUn/min  Assessment:  Mary Reilly, 27 y.o., G1P0, with an IUP @ [redacted]w[redacted]d, presented for IOL for postdates at 41.1 weeks.  Prenatal care has been provided by Dr. Katrinka Blazing. Connye Burkitt [facility]). Pregnancy complicated by: IVF pregnancy, Mild fetal renal pyelectasis (resolved), Severe endometriosis (renal/bowel involvement, previously complicated by left  hydronephrosis - s/p surgical management of such with, ureteroneocystotomy in 2023). ProtraCTED in latent labor. 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 No cervical change noted.  Patient Active Problem List   Diagnosis Date Noted   Non-reassuring electronic fetal monitoring tracing 01/18/2023   Encounter for induction of labor 01/17/2023   NICHD: Category 2: maternal position change, amnio started, and pitcon turned off.   Membranes:  6/17 @ 1829 AROM, blood tinged x no s/s of infection  Induction:    Cytotec x 1004 VA on 6/17  Foley Bulb: N/A  Pitocin - 4 and turned off  Pain management:               IV pain management: x PRN  Nitrous: PRN             Epidural placement: Placed  @ 1128 on 6/18  GBS Negative   Plan: Continue labor plan Continuous monitoring Stop pitocin Amnio 300mg  bolus and `161ml maintenance.  Anticipate IUPC if increasing pitocin not does induce cervical change.  Anticipate labor progression and vaginal delivery.  Clip site SCD Foley in Bicitra Plan for TXA  Azith/Vanc and clinda en route to to the OR DR Richardson Dopp to proceed with PCS for NRFHT.   [facility] CNM, FNP-C, PMHNP-BC  [address]  [place], [place] [address]  Cell: [phone]  Office Phone: [phone] Fax: [phone] 01/18/2023  3:42 AM

## 2023-01-18 NOTE — Progress Notes (Signed)
   Labor Progress Note  Mary Reilly, 27 y.o., G1P0, with an IUP @ [redacted]w[redacted]d, presented for IOL for postdates at 41.1 weeks.  Prenatal care has been provided by Dr. Katrinka Blazing. Connye Burkitt [facility]). Pregnancy complicated by: IVF pregnancy, Mild fetal renal pyelectasis (resolved), Severe endometriosis (renal/bowel involvement, previously complicated by left hydronephrosis - s/p surgical management of such with, ureteroneocystotomy in 2023).  Subjective: Pt walking around and tolerating well, unable to sleep r/t cxt pain but still desires all natural.  Patient Active Problem List   Diagnosis Date Noted   Encounter for induction of labor 01/17/2023   Objective: BP 135/86   Pulse 60   Temp 97.7 F (36.5 C) (Oral)   Resp 16   Ht 5' (1.524 m)   Wt 83.9 kg   BMI 36.13 kg/m  No intake/output data recorded. No intake/output data recorded. NST: FHR baseline 135 bpm, Variability: moderate, Accelerations:present, Decelerations:  Occ varible= Cat 1/Reactive CTX:  regular, every 2-3 minutes, lasting 40-80 seconds Uterus gravid, soft non tender, moderate to palpate with contractions.  SVE:  Dilation: 4.5 Effacement (%): 70 Station: -1 Exam by:: Lesly Rubenstein, CNM Pitocin at N/A mUn/min  Assessment:  Mary Reilly, 27 y.o., G1P0, with an IUP @ [redacted]w[redacted]d, presented for IOL for postdates at 41.1 weeks.  Prenatal care has been provided by Dr. Katrinka Blazing. Connye Burkitt [facility]). Pregnancy complicated by: IVF pregnancy, Mild fetal renal pyelectasis (resolved), Severe endometriosis (renal/bowel involvement, previously complicated by left hydronephrosis - s/p surgical management of such with, ureteroneocystotomy in 2023). Progressing in latent labor.  Patient Active Problem List   Diagnosis Date Noted   Encounter for induction of labor 01/17/2023   NICHD: Category 1  Membranes:  6/17 @ 1829 AROM, blood tinged x no s/s of infection  Induction:    Cytotec x 1004 VA on 6/17  Foley Bulb:  N/A  Pitocin - N/A  Pain management:               IV pain management: x PRN  Nitrous: PRN             Epidural placement: PRN  GBS Negative   Plan: Continue labor plan Continuous monitoring Rest Ambulate Frequent position changes to facilitate fetal rotation and descent. Will reassess with cervical exam at 4 hours or earlier if necessary Expectant management.  Anticipate labor progression and vaginal delivery.  Plan for TXA abd Pitocin PP Prophylactic r/t IVF and increased risk on PPH r/t retained placenta.   [facility] CNM, FNP-C, PMHNP-BC  [address]  [place], [place] [address]  Cell: [phone]  Office Phone: [phone] Fax: [phone] 01/18/2023  3:42 AM

## 2023-01-18 NOTE — Progress Notes (Signed)
   Labor Progress Note  Mary Reilly, 27 y.o., G1P0, with an IUP @ [redacted]w[redacted]d, presented for IOL for postdates at 41.1 weeks.  Prenatal care has been provided by Dr. Katrinka Blazing. Connye Burkitt [facility]). Pregnancy complicated by: IVF pregnancy, Mild fetal renal pyelectasis (resolved), Severe endometriosis (renal/bowel involvement, previously complicated by left hydronephrosis - s/p surgical management of such with, ureteroneocystotomy in 2023).  Subjective: Pt tolerating cxt, continues in latent labor.  Patient Active Problem List   Diagnosis Date Noted   Encounter for induction of labor 01/17/2023   Objective: BP 122/82   Pulse (!) 59   Temp 97.7 F (36.5 C) (Oral)   Resp 16   Ht 5' (1.524 m)   Wt 83.9 kg   BMI 36.13 kg/m  No intake/output data recorded. No intake/output data recorded. NST: FHR baseline 135 bpm, Variability: moderate, Accelerations:present, Decelerations:  Absent= Cat 1/Reactive CTX:  regular, every 1.5-4 minutes, lasting 40-80 seconds Uterus gravid, soft non tender, moderate to palpate with contractions.  SVE:  Dilation: 5 Effacement (%): 50 Station: -2 Exam by:: Lesly Rubenstein, CNM Pitocin at N/A mUn/min  Assessment:  Mary Reilly, 27 y.o., G1P0, with an IUP @ [redacted]w[redacted]d, presented for IOL for postdates at 41.1 weeks.  Prenatal care has been provided by Dr. Katrinka Blazing. Connye Burkitt [facility]). Pregnancy complicated by: IVF pregnancy, Mild fetal renal pyelectasis (resolved), Severe endometriosis (renal/bowel involvement, previously complicated by left hydronephrosis - s/p surgical management of such with, ureteroneocystotomy in 2023). Progressing in latent labor.  Patient Active Problem List   Diagnosis Date Noted   Encounter for induction of labor 01/17/2023   NICHD: Category 1  Membranes:  6/17 @ 1829 AROM, blood tinged x no s/s of infection  Induction:    Cytotec x 1004 VA on 6/17  Foley Bulb: N/A  Pitocin - N/A  Pain management:               IV  pain management: x PRN  Nitrous: PRN             Epidural placement: PRN  GBS Negative   Plan: Continue labor plan Continuous monitoring Rest Ambulate Frequent position changes to facilitate fetal rotation and descent. Will reassess with cervical exam at 4 hours or earlier if necessary Expectant management.  Anticipate labor progression and vaginal delivery.  Plan for TXA abd Pitocin PP Prophylactic r/t IVF and increased risk on PPH r/t retained placenta.   [facility] CNM, FNP-C, PMHNP-BC  [address]  [place], [place] [address]  Cell: [phone]  Office Phone: [phone] Fax: [phone] 01/18/2023  3:42 AM

## 2023-01-18 NOTE — Anesthesia Postprocedure Evaluation (Signed)
Anesthesia Post Note  Patient: Mary Reilly  Procedure(s) Performed: CESAREAN SECTION     Patient location during evaluation: PACU Anesthesia Type: Epidural Level of consciousness: awake Pain management: pain level controlled Vital Signs Assessment: post-procedure vital signs reviewed and stable Respiratory status: spontaneous breathing, nonlabored ventilation and respiratory function stable Cardiovascular status: stable Postop Assessment: no headache, no backache and epidural receding Anesthetic complications: no   No notable events documented.  Last Vitals:  Vitals:   01/18/23 1611 01/18/23 1633  BP: 120/67 114/75  Pulse: 73 78  Resp: 16 18  Temp:  37.1 C  SpO2: 96% 97%    Last Pain:  Vitals:   01/18/23 1655  TempSrc:   PainSc: 0-No pain   Pain Goal:                Epidural/Spinal Function Cutaneous sensation: Tingles (01/18/23 1655), Patient able to flex knees: Yes (01/18/23 1655), Patient able to lift hips off bed: Yes (01/18/23 1655), Back pain beyond tenderness at insertion site: No (01/18/23 1655), Progressively worsening motor and/or sensory loss: No (01/18/23 1655), Bowel and/or bladder incontinence post epidural: No (01/18/23 1655)  Linton Rump

## 2023-01-18 NOTE — Progress Notes (Signed)
Patient with repetitive variable and late decelerations resolved with cessation of pitocin. Recommend cesarean section due to nonreassuring fetal heart rate and fetal intolerance of labor. R/B/A of cesarean section discussed with the patient including but not limited to infection, bleeding damage to bowel bladder and baby with the need for further surgery. R/O transfusion HIV/ Hep B&C discussed. Pt voiced understanding and desires to proceed with cesarean section.

## 2023-01-18 NOTE — Anesthesia Procedure Notes (Signed)
Epidural Patient location during procedure: OB Start time: 01/18/2023 11:33 AM End time: 01/18/2023 11:38 AM  Staffing Anesthesiologist: Linton Rump, MD Performed: anesthesiologist   Preanesthetic Checklist Completed: patient identified, IV checked, site marked, risks and benefits discussed, surgical consent, monitors and equipment checked, pre-op evaluation and timeout performed  Epidural Patient position: sitting Prep: DuraPrep and site prepped and draped Patient monitoring: continuous pulse ox and blood pressure Approach: midline Location: L3-L4 Injection technique: LOR saline  Needle:  Needle type: Tuohy  Needle gauge: 17 G Needle length: 9 cm and 9 Needle insertion depth: 4.5 cm Catheter type: closed end flexible Catheter size: 19 Gauge Catheter at skin depth: 9 cm Test dose: negative  Assessment Events: blood not aspirated, no cerebrospinal fluid, injection not painful, no injection resistance, no paresthesia and negative IV test  Additional Notes The patient has requested an epidural for labor pain management. Risks and benefits including, but not limited to, infection, bleeding, local anesthetic toxicity, headache, hypotension, back pain, block failure, etc. were discussed with the patient. The patient expressed understanding and consented to the procedure. I confirmed that the patient has no bleeding disorders and is not taking blood thinners. I confirmed the patient's last platelet count with the nurse. A time-out was performed immediately prior to the procedure. Please see nursing documentation for vital signs. Sterile technique was used throughout the whole procedure. Once LOR achieved, the epidural catheter threaded easily without resistance. Aspiration of the catheter was negative for blood and CSF. The epidural was dosed slowly and an infusion was started.  1 attempt(s)Reason for block:procedure for pain

## 2023-01-18 NOTE — Transfer of Care (Signed)
Immediate Anesthesia Transfer of Care Note  Patient: Mary Reilly  Procedure(s) Performed: CESAREAN SECTION  Patient Location: PACU  Anesthesia Type:Epidural  Level of Consciousness: awake, alert , and oriented  Airway & Oxygen Therapy: Patient Spontanous Breathing  Post-op Assessment: Report given to RN and Post -op Vital signs reviewed and stable  Post vital signs: Reviewed and stable  Last Vitals:  Vitals Value Taken Time  BP 161/141 01/18/23 1503  Temp    Pulse 82 01/18/23 1504  Resp 13 01/18/23 1504  SpO2 97 % 01/18/23 1504  Vitals shown include unvalidated device data.  Last Pain:  Vitals:   01/18/23 1150  TempSrc:   PainSc: 5          Complications: No notable events documented.

## 2023-01-19 LAB — CBC
HCT: 28.7 % — ABNORMAL LOW (ref 36.0–46.0)
Hemoglobin: 9.9 g/dL — ABNORMAL LOW (ref 12.0–15.0)
MCH: 31.6 pg (ref 26.0–34.0)
MCHC: 34.5 g/dL (ref 30.0–36.0)
MCV: 91.7 fL (ref 80.0–100.0)
Platelets: 170 10*3/uL (ref 150–400)
RBC: 3.13 MIL/uL — ABNORMAL LOW (ref 3.87–5.11)
RDW: 12.8 % (ref 11.5–15.5)
WBC: 18.2 10*3/uL — ABNORMAL HIGH (ref 4.0–10.5)
nRBC: 0 % (ref 0.0–0.2)

## 2023-01-19 MED ORDER — ACETAMINOPHEN 500 MG PO TABS: 1000.0000 mg | ORAL_TABLET | Freq: Three times a day (TID) | ORAL | 0 refills | Status: AC | PRN

## 2023-01-19 MED ORDER — OXYCODONE HCL 5 MG PO TABS: 5.0000 mg | ORAL_TABLET | ORAL | 0 refills | Status: AC | PRN

## 2023-01-19 MED ORDER — IBUPROFEN 600 MG PO TABS: 600.0000 mg | ORAL_TABLET | Freq: Four times a day (QID) | ORAL | 1 refills | Status: AC | PRN

## 2023-01-19 NOTE — Progress Notes (Signed)
Subjective: Postpartum Day 1: Cesarean Delivery Patient reports tolerating PO, + flatus, and no problems voiding.  Pain score is 0.   Objective: Vital signs in last 24 hours: Temp:  [97.8 F (36.6 C)-98.9 F (37.2 C)] 97.8 F (36.6 C) (06/19 1100) Pulse Rate:  [66-92] 92 (06/19 1100) Resp:  [15-18] 18 (06/19 0515) BP: (108-130)/(57-89) 130/89 (06/19 1100) SpO2:  [96 %-100 %] 100 % (06/19 1100)  Physical Exam:  General: alert, cooperative, and no distress Lochia: appropriate Uterine Fundus: firm Incision: healing well DVT Evaluation: No evidence of DVT seen on physical exam.  Recent Labs    01/17/23 0948 01/19/23 0548  HGB 12.5 9.9*  HCT 37.8 28.7*    Assessment/Plan: Status post Cesarean section. Doing well postoperatively.  Continue current care. Breastfeeding- lactation consultation.  Encouraged ambulation  Desires circumcision of newborn. R/B/A of circumcision discussed.   Gerald Leitz, MD 01/19/2023, 1:52 PM

## 2023-01-19 NOTE — Lactation Note (Signed)
This note was copied from a baby's chart. Lactation Consultation Note  Patient Name: Mary Reilly [id number] Date: 01/19/2023 Age:27 hours, 3 % weight loss  Reason for consult: Initial assessment;Primapara;1st time breastfeeding;Term;Infant weight loss Mom shared the baby had an episode of turning blue yesterday and had to spend time in the nursery on O2 . No episodes since.  LC noted to be stuffy at this consult with assisting to latch.  Baby opened wide and the feeding was on / and off 10 mins with swallows.  LC recommended prior to latching - burp the baby , in between and after to keep moving the gas down. LC reassured parents 5 stools is a positive.  LC reviewed steps for latching and mom is wearing the shells . Per mom  they are helping.   Maternal Data Has patient been taught Hand Expression?: Yes Does the patient have breastfeeding experience prior to this delivery?: No  Feeding Mother's Current Feeding Choice: Breast Milk  LATCH Score Latch: Repeated attempts needed to sustain latch, nipple held in mouth throughout feeding, stimulation needed to elicit sucking reflex.  Audible Swallowing: A few with stimulation  Type of Nipple: Everted at rest and after stimulation  Comfort (Breast/Nipple): Soft / non-tender  Hold (Positioning): Assistance needed to correctly position infant at breast and maintain latch.  LATCH Score: 7   Lactation Tools Discussed/Used Tools: Shells;Pump;Flanges Flange Size: 24 Breast pump type: Manual Pump Education: Milk Storage;Other (comment) (per mom has been instructed)  Interventions Interventions: Breast feeding basics reviewed;Assisted with latch;Skin to skin;Breast massage;Hand express;Reverse pressure;Breast compression;Adjust position;Support pillows;Position options;Shells;Hand pump;Education;LC Services brochure  Discharge Pump: Personal;DEBP (per mom Spectra) WIC Program: No  Consult Status Consult Status: Follow-up Date:  01/20/23 Follow-up type: In-patient    Matilde Sprang Chapman Matteucci 01/19/2023, 9:03 AM

## 2023-01-20 LAB — SURGICAL PATHOLOGY

## 2023-01-20 MED ORDER — FERROUS SULFATE 325 (65 FE) MG PO TABS: 325.0000 mg | ORAL_TABLET | Freq: Every day | ORAL | 0 refills | Status: AC

## 2023-01-20 MED ORDER — FERROUS SULFATE 325 (65 FE) MG PO TABS
325.0000 mg | ORAL_TABLET | Freq: Every day | ORAL | Status: DC
  Administered 2023-01-20: 325 mg via ORAL
  Filled 2023-01-20: qty 1

## 2023-01-20 NOTE — Lactation Note (Signed)
This note was copied from a baby's chart. Lactation Consultation Note  Patient Name: Mary Reilly [id number] Date: 01/20/2023 Age:27 hours  Reason for consult: Follow-up assessment;Primapara;1st time breastfeeding;Term  Mother reports she just fed baby for 25 minutes and she feels the feedings are consistently improving. She said she saw colostrum "drooling after the last feeding". Baby is currently being held by father and baby is calm and alert.  Basic breastfeeding discharge teaching regarding engorgement management, feeding frequency and assessing intake and output and answering questions.  Mom made aware of O/P services, breastfeeding support groups, and our phone # for post-discharge questions.         Maternal Data    Feeding Mother's Current Feeding Choice: Breast Milk  LATCH Score Latch: Repeated attempts needed to sustain latch, nipple held in mouth throughout feeding, stimulation needed to elicit sucking reflex.  Audible Swallowing: Spontaneous and intermittent  Type of Nipple: Everted at rest and after stimulation  Comfort (Breast/Nipple): Soft / non-tender  Hold (Positioning): No assistance needed to correctly position infant at breast.  LATCH Score: 9   Interventions Interventions: Education  Discharge Discharge Education: Engorgement and breast care;Warning signs for feeding baby;Other (comment) (informed of OP LC services at [facility]) Pump: Personal  Consult Status Consult Status: Complete Date: 01/20/23    Omar Person 01/20/2023, 4:29 PM

## 2023-01-21 NOTE — Discharge Summary (Signed)
Postpartum Discharge Summary  Date of Service updated 01/20/2023     Patient Name: Mary Reilly DOB: [birth year] MRN: [id number]  Date of admission: 01/17/2023 Delivery date:01/18/2023  Delivering provider: Gerald Leitz  Date of discharge: 01/20/2023  Admitting diagnosis: Encounter for induction of labor [Z34.90] Non-reassuring electronic fetal monitoring tracing [O36.8390] Pregnancy [Z34.90] Intrauterine pregnancy: [redacted]w[redacted]d     Secondary diagnosis:  Principal Problem:   Encounter for induction of labor Active Problems:   Non-reassuring electronic fetal monitoring tracing   Pregnancy   S/P cesarean section  Additional problems: None    Discharge diagnosis: Term Pregnancy Delivered                                              Post partum procedures: None Augmentation: AROM, Pitocin, and Cytotec Complications: None  Hospital course: Induction of Labor With Cesarean Section   27 y.o. yo G1P1001 at [redacted]w[redacted]d was admitted to the hospital 01/17/2023 for induction of labor. Patient had a labor course significant for cytotec for cerivcal ripening followed by pitocin . The patient went for cesarean section due to Non-Reassuring FHR. Delivery details are as follows: Membrane Rupture Time/Date: 6:29 PM ,01/17/2023   Delivery Method:C-Section, Low Transverse  Details of operation can be found in separate operative Note.  Patient had a postpartum course that was uncomplicated. She is ambulating, tolerating a regular diet, passing flatus, and urinating well.  Patient is discharged home in stable condition on 01/21/23.      Newborn Data: Birth date:01/18/2023  Birth time:1:57 PM  Gender:Female  Living status:Living  Apgars:9 ,9  Weight:3230 g                                Magnesium Sulfate received: No BMZ received: No Rhophylac:N/A MMR:N/A T-DaP:Given prenatally Flu: N/A Transfusion:No  Physical exam  Vitals:   01/19/23 1100 01/19/23 1711 01/19/23 2002 01/20/23 0615  BP:  130/89 106/68 102/68 95/62  Pulse: 92 85 81 87  Resp:  18 18 18   Temp: 97.8 F (36.6 C) 98 F (36.7 C) 98 F (36.7 C) 98.3 F (36.8 C)  TempSrc: Oral  Oral Oral  SpO2: 100% 99% 97% 99%  Weight:      Height:       General: alert, cooperative, and no distress Lochia: appropriate Uterine Fundus: firm Incision: Healing well with no significant drainage DVT Evaluation: No evidence of DVT seen on physical exam. Labs: Lab Results  Component Value Date   WBC 18.2 (H) 01/19/2023   HGB 9.9 (L) 01/19/2023   HCT 28.7 (L) 01/19/2023   MCV 91.7 01/19/2023   PLT 170 01/19/2023       No data to display         Edinburgh Score:    01/20/2023    3:33 PM  Edinburgh Postnatal Depression Scale Screening Tool  I have been able to laugh and see the funny side of things. 0  I have looked forward with enjoyment to things. 0  I have blamed myself unnecessarily when things went wrong. 1  I have been anxious or worried for no good reason. 0  I have felt scared or panicky for no good reason. 0  Things have been getting on top of me. 0  I have been so unhappy that I have had  difficulty sleeping. 0  I have felt sad or miserable. 0  I have been so unhappy that I have been crying. 0  The thought of harming myself has occurred to me. 0  Edinburgh Postnatal Depression Scale Total 1      After visit meds:  Allergies as of 01/20/2023       Reactions   Cephalosporins    Macrobid [nitrofurantoin] Nausea Only   Other Hives   CHG   Sulfa Antibiotics         Medication List     STOP taking these medications    amoxicillin-clavulanate 875-125 MG tablet Commonly known as: Augmentin   etonogestrel-ethinyl estradiol 0.12-0.015 MG/24HR vaginal ring Commonly known as: NUVARING       TAKE these medications    acetaminophen 500 MG tablet Commonly known as: TYLENOL Take 2 tablets (1,000 mg total) by mouth every 8 (eight) hours as needed.   ferrous sulfate 325 (65 FE) MG tablet Take  1 tablet (325 mg total) by mouth daily with breakfast.   fluocinonide cream 0.05 % Commonly known as: LIDEX Apply 1 application topically 2 (two) times daily.   ibuprofen 600 MG tablet Commonly known as: ADVIL Take 1 tablet (600 mg total) by mouth every 6 (six) hours as needed.   oxyCODONE 5 MG immediate release tablet Commonly known as: Oxy IR/ROXICODONE Take 1-2 tablets (5-10 mg total) by mouth every 4 (four) hours as needed for moderate pain.         Discharge home in stable condition Infant Feeding: Breast Infant Disposition:home with mother Discharge instruction: per After Visit Summary and Postpartum booklet. Activity: Advance as tolerated. Pelvic rest for 6 weeks.  Diet: routine diet Anticipated Birth Control: Unsure Postpartum Appointment:2 weeks Additional Postpartum F/U: Incision check 2 weeks  Future Appointments:No future appointments. Follow up Visit:  Follow-up Information     Steva Ready, DO. Schedule an appointment as soon as possible for a visit in 2 week(s).   Specialty: Obstetrics and Gynecology Why: please call to schedule an incision check in 2 weeks Contact information: [address] [place] [address] [phone]                     01/21/2023 Gerald Leitz, MD

## 2023-02-09 ENCOUNTER — Telehealth (HOSPITAL_COMMUNITY): Payer: Self-pay | Admitting: *Deleted

## 2023-02-09 NOTE — Telephone Encounter (Signed)
02/09/2023  Name: Mary Reilly MRN: [id number] DOB: 12/31/1995  Reason for Call:  Transition of Care Hospital Discharge Call  Contact Status: Patient Contact Status: Complete  Language assistant needed: Interpreter Mode: Interpreter Not Needed        Follow-Up Questions: Do You Have Any Concerns About Your Health As You Heal From Delivery?: No Do You Have Any Concerns About Your Infants Health?: No  Edinburgh Postnatal Depression Scale:  In the Past 7 Days: I have been able to laugh and see the funny side of things.: As much as I always could I have looked forward with enjoyment to things.: As much as I ever did I have blamed myself unnecessarily when things went wrong.: No, never I have been anxious or worried for no good reason.: No, not at all I have felt scared or panicky for no good reason.: No, not at all Things have been getting on top of me.: No, I have been coping as well as ever I have been so unhappy that I have had difficulty sleeping.: Not at all I have felt sad or miserable.: No, not at all I have been so unhappy that I have been crying.: No, never The thought of harming myself has occurred to me.: Never Inocente Salles Postnatal Depression Scale Total: 0  PHQ2-9 Depression Scale:     Discharge Follow-up: Edinburgh score requires follow up?: No Patient was advised of the following resources:: Breastfeeding Support Group, Support Group (declines PPgroup information via email)  Post-discharge interventions: Reviewed Newborn Safe Sleep Practices  Salena Saner, RN 02/09/2023 14:20

## 2024-06-11 NOTE — Progress Notes (Signed)
 GYN ANNUAL EXAM  HPI:   Mary Reilly is a 28 y.o.  G2P1011 who presents for annual exam.    Chief Complaint  Patient presents with  . Gynecologic Exam    New pt,  nl pap at [facility] 06/12/23, sexually active,  no std,    currently going thru treatment for ectopic pregnancy     History of Present Illness The patient presents for for annual exam.  She is currently undergoing treatment for a suspected ectopic pregnancy, which has not been definitively diagnosed due to the absence of visible signs. At 6 weeks and 5 days, no intrauterine pregnancy was detected. Her quantitative hCG levels have been persistently increasing, even though her progesterone levels have decreased following discontinuation of progesterone therapy. She received a methotrexate injection two Fridays ago, with subsequent lab monitoring on the following Monday and Thursday. Despite the methotrexate treatment, her hCG levels continued to rise from 2016 on Friday to 2400 on Monday and 2600 on Thursday, prompting a second dose of methotrexate last Friday. She is scheduled for lab work today at [facility]. If her hCG levels do not decrease, a salpingectomy may be considered. She underwent an embryo transfer on 06/02/2024, but her hCG levels were slightly sluggish, leading to an early ultrasound at 6 weeks. The ultrasound did not reveal any significant findings, except for a small blip in the uterus, which was initially thought to be a gestational sac. However, a follow-up ultrasound at 6 weeks and 5 days showed no changes in the blip, and her hCG levels had risen, ruling out a gestational sac. She discontinued progesterone therapy on 05/30/2024 after the ectopic pregnancy diagnosis. Her hCG level dropped from 48 to 25 within a week of stopping the progesterone. She began a new cycle on 06/06/2024, which she finds puzzling given the continued rise in her hCG levels. She continues to experience bleeding and severe cramping, which she describes as more  intense than labor pain. She has taken two Percocet tablets for pain relief. She also reports passing gas last night, which provided some relief, leading her to suspect that her symptoms may be related to stool or gas. She has been working [employer] with [employer], where her labs are drawn during her work hours. She has seen Dr. Luci for one visit, who referred her for pelvic floor physical therapy before she was pregnant, which was very helpful.  Obstetrical History discussed: - Embryo transfer on 06/02/2024   Health Maintenance:  Pap: per pt nl 06/12/23 at [facility]   History of abnormal paps: No Sees PCP regularly: Yes      Exercise: moderately active    Domestic violence: No Gardasil series complete: Yes  Gynecologic History:    Patient's last menstrual period was 06/06/2024 (exact date). The patient is sexually active. single partner, contraception - none  -------------------------------------------------------------------------------------  OB History     Gravida  2   Para  1   Term  1   Preterm      AB  1   Living  1      SAB      IAB      Ectopic  1   Multiple      Live Births  1          Patient Active Problem List   Diagnosis Date Noted  . 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 No pertinent past medical history. Past Surgical History:  Procedure Laterality Date  .  Knee arthroscopy w/ acl reconstruction     right knee   Social History[3] Family History  Problem Relation Age of Onset  . Breast cancer Maternal Grandmother   . No Known Problems Father   . No Known Problems Mother   . No Known Problems Brother   . No Known Problems Sister   . Colon cancer Neg Hx   . Ovarian cancer Neg Hx    Immunization History  Administered Date(s) Administered  . Gardasil 9 08/15/2018, 03/02/2019, 10/31/2019  . Influenza Quad 0.4ml single-dose vial(Fluzone) 05/25/2018  . Pfizer-BioNTech  COVID-19 Monovalent Vaccine mRNA 08/01/2019, 08/22/2019, 04/30/2020  . Tdap 02/16/2007, 08/15/2018   Review of Systems: Pertinent items are noted in HPI.  Physical Exam: Vitals:   06/11/24 0934  BP: 102/62  BP Location: Left Upper Arm  Patient Position: Sitting  Weight: 161 lb 6.4 oz (73.2 kg)  Height: 5' 4 (1.626 m)   Body mass index is 27.7 kg/m.  GEN: Well developed, alert and oriented x 3 SKIN: (exposed areas only)   Rashes: No  Lesions: No Ulcers: No NECK:  supple, no significant adenopathy, thyroid is normal in size without nodules or tenderness BREASTS: symmetric, normal axillary lymph nodes bilaterally  Right: no nipple discharge, no tenderness, no masses  Left: no nipple discharge, no tenderness, no masses LYMPHATIC: no significant cervical, supraclavicular, axillary or inguinal adenopathy RESPIRATORY: lungs clear to auscultation bilaterally, no rales, rhonchi, crackles CARDIOVASCULAR: Regular rate and rhythm ABDOMEN: Soft, mild tenderness in LLQ, no hernia or masses.   Normal liver and spleen. GENITOURINARY EXAM:  EXT GENITALIA: normal external genitalia, no erythema, no discharge  URETHRA / URETHRAL MEATUS: WNL  VAGINA: normal appearing vagina with normal color and discharge, no lesions, minimal blood in vaginal vault, endometriosis lesions noted posterior to the cervix  CERVIX: normal appearing cervix without discharge or lesions  UTERUS: normal size, mobile, non-tender, normal shape and consistency  Left ADNEXA: No masses, nodularity, tenderness  Right ADNEXA: No masses, nodularity, tenderness Chaperone present: Powell Portugal, MA  IMPRESSION / PLAN:  1. Encntr for gyn exam (general) (routine) w abnormal findings      2. Ectopic pregnancy, unspecified location, unspecified whether intrauterine pregnancy present (*)       Assessment & Plan Health maintenance: - A Pap smear was conducted last year on 05/12/2023, and the results were normal. Records  requestred - Continue treatment plan per [facility]I regarding ectopic pregnancy - Return in 1 year for annual exam   COUNSELING: PNV/Daily multivitamin encouraged.  Self breast awareness encouraged with annual mammogram.  Calcium 1200mg  daily and Vitamin D 1000-2000 International Units daily encouraged in addition to weight bearing exercise for bone health.   Sunscreen use/skin check encouraged. Healthy diet and routine exercise encouraged - discussed modifiable risk factors to prevent CV disease.  Screening labs offered when patient is fasting. Discussed perimenopausal/menopausal state. Safe sex practices encouraged.  Tobacco and drug avoidance encouraged ASCCP guidelines reviewed and HPV discussed  Eleanor GORMAN Jury, DO       [1] Allergies Allergen Reactions  . Cephalosporins Rash  . Chlorhexidine Rash  . Sulfa Antibiotics Rash  . Sulfamethoxazole Rash  . Sulfur Unknown  . Macrobid [Nitrofurantoin] Nausea Only  [2] Outpatient Medications Marked as Taking for the 06/11/24 encounter (Annual Physical) with Melissa Stout Davies, DO  Medication Sig Dispense Refill  . oxyCODONE -acetaminophen  (PERCOCET,ENDOCET) 5-325 mg per tablet 1 tablet as needed    [3] Social History Socioeconomic History  . Marital status: Married  . Number  of children: 0  Occupational History  . Occupation: PA School  Tobacco Use  . Smoking status: Never  . Smokeless tobacco: Never  Vaping Use  . Vaping status: Never Used  Substance and Sexual Activity  . Alcohol use: Never  . Drug use: Never  . Sexual activity: Yes    Partners: Male    Birth control/protection: None

## 2024-06-15 ENCOUNTER — Ambulatory Visit (HOSPITAL_BASED_OUTPATIENT_CLINIC_OR_DEPARTMENT_OTHER)
Admission: RE | Admit: 2024-06-15 | Discharge: 2024-06-15 | Disposition: A | Discharge status: Home / Self Care | Source: Ambulatory Visit | Attending: Internal Medicine | Admitting: Internal Medicine

## 2024-06-15 ENCOUNTER — Other Ambulatory Visit (HOSPITAL_BASED_OUTPATIENT_CLINIC_OR_DEPARTMENT_OTHER): Payer: Self-pay | Admitting: Internal Medicine

## 2024-06-15 DIAGNOSIS — R10A2 Flank pain, left side: Secondary | ICD-10-CM | POA: Insufficient documentation

## 2024-06-15 DIAGNOSIS — N23 Unspecified renal colic: Secondary | ICD-10-CM

## 2024-06-15 MED ORDER — IOHEXOL 300 MG/ML  SOLN
80.0000 mL | Freq: Once | INTRAMUSCULAR | Status: AC | PRN
  Administered 2024-06-15: 80 mL via INTRAVENOUS

## 2024-06-15 NOTE — Progress Notes (Signed)
 This technologist spoke with the patient she confirms she has a non-viable pregnancy and consent to proceeding with the CT scan of her Abd/Pelvis
